# Patient Record
Sex: Male | Born: 1985 | Race: White | Hispanic: No | Marital: Married | Smoking: Never smoker
Health system: Southern US, Community
[De-identification: ages and names within clinical notes are randomized; demographics above are authoritative.]

## PROBLEM LIST (undated history)

## (undated) DIAGNOSIS — K219 Gastro-esophageal reflux disease without esophagitis: Secondary | ICD-10-CM

## (undated) DIAGNOSIS — I471 Supraventricular tachycardia: Secondary | ICD-10-CM

## (undated) DIAGNOSIS — I4719 Other supraventricular tachycardia: Secondary | ICD-10-CM

## (undated) DIAGNOSIS — U071 COVID-19: Secondary | ICD-10-CM

## (undated) HISTORY — DX: COVID-19: U07.1

## (undated) HISTORY — PX: COLONOSCOPY: SHX174

---
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
## 2008-09-10 ENCOUNTER — Emergency Department: Payer: Self-pay | Admitting: Emergency Medicine

## 2009-01-21 ENCOUNTER — Ambulatory Visit: Payer: Self-pay | Admitting: Gastroenterology

## 2009-03-18 ENCOUNTER — Ambulatory Visit: Payer: Self-pay | Admitting: Gastroenterology

## 2015-09-26 ENCOUNTER — Other Ambulatory Visit: Payer: Self-pay | Admitting: Family Medicine

## 2015-09-27 LAB — CMP12+LP+TP+TSH+6AC+CBC/D/PLT
ALT: 13 IU/L (ref 0–44)
AST: 11 IU/L (ref 0–40)
Albumin/Globulin Ratio: 2 (ref 1.1–2.5)
Albumin: 4.6 g/dL (ref 3.5–5.5)
Alkaline Phosphatase: 58 IU/L (ref 39–117)
BASOS ABS: 0 10*3/uL (ref 0.0–0.2)
BUN/Creatinine Ratio: 10 (ref 8–19)
BUN: 12 mg/dL (ref 6–20)
Basos: 1 %
Bilirubin Total: 1.6 mg/dL — ABNORMAL HIGH (ref 0.0–1.2)
CREATININE: 1.24 mg/dL (ref 0.76–1.27)
Calcium: 9.5 mg/dL (ref 8.7–10.2)
Chloride: 99 mmol/L (ref 97–108)
Chol/HDL Ratio: 2.4 ratio units (ref 0.0–5.0)
Cholesterol, Total: 128 mg/dL (ref 100–199)
EOS (ABSOLUTE): 0.3 10*3/uL (ref 0.0–0.4)
Eos: 6 %
Estimated CHD Risk: <0.5 times avg. (ref 0.0–1.0)
Free Thyroxine Index: 3 (ref 1.2–4.9)
GFR calc Af Amer: 91 mL/min/{1.73_m2} (ref >59)
GFR, EST NON AFRICAN AMERICAN: 79 mL/min/{1.73_m2} (ref >59)
GGT: 19 IU/L (ref 0–65)
GLOBULIN, TOTAL: 2.3 g/dL (ref 1.5–4.5)
GLUCOSE: 106 mg/dL — AB (ref 65–99)
HDL: 54 mg/dL (ref >39)
Hematocrit: 43.9 % (ref 37.5–51.0)
Hemoglobin: 15 g/dL (ref 12.6–17.7)
IRON: 117 ug/dL (ref 38–169)
Immature Grans (Abs): 0 10*3/uL (ref 0.0–0.1)
Immature Granulocytes: 0 %
LDH: 103 IU/L — ABNORMAL LOW (ref 121–224)
LDL Calculated: 63 mg/dL (ref 0–99)
LYMPHS ABS: 1.7 10*3/uL (ref 0.7–3.1)
Lymphs: 39 %
MCH: 31.3 pg (ref 26.6–33.0)
MCHC: 34.2 g/dL (ref 31.5–35.7)
MCV: 92 fL (ref 79–97)
MONOCYTES: 9 %
MONOS ABS: 0.4 10*3/uL (ref 0.1–0.9)
NEUTROS ABS: 1.9 10*3/uL (ref 1.4–7.0)
Neutrophils: 45 %
PHOSPHORUS: 3.6 mg/dL (ref 2.5–4.5)
PLATELETS: 204 10*3/uL (ref 150–379)
POTASSIUM: 4.2 mmol/L (ref 3.5–5.2)
RBC: 4.8 x10E6/uL (ref 4.14–5.80)
RDW: 13.2 % (ref 12.3–15.4)
Sodium: 139 mmol/L (ref 134–144)
T3 UPTAKE RATIO: 38 % (ref 24–39)
T4 TOTAL: 7.8 ug/dL (ref 4.5–12.0)
TSH: 1.61 u[IU]/mL (ref 0.450–4.500)
Total Protein: 6.9 g/dL (ref 6.0–8.5)
Triglycerides: 53 mg/dL (ref 0–149)
URIC ACID: 6.9 mg/dL (ref 3.7–8.6)
VLDL Cholesterol Cal: 11 mg/dL (ref 5–40)
WBC: 4.3 10*3/uL (ref 3.4–10.8)

## 2016-03-18 ENCOUNTER — Emergency Department
Admission: EM | Admit: 2016-03-18 | Discharge: 2016-03-18 | Disposition: A | Discharge status: Home / Self Care | Payer: Worker's Compensation | Attending: Emergency Medicine | Admitting: Emergency Medicine

## 2016-03-18 ENCOUNTER — Emergency Department: Payer: Worker's Compensation

## 2016-03-18 DIAGNOSIS — S63633A Sprain of interphalangeal joint of left middle finger, initial encounter: Secondary | ICD-10-CM | POA: Diagnosis not present

## 2016-03-18 DIAGNOSIS — Y929 Unspecified place or not applicable: Secondary | ICD-10-CM | POA: Diagnosis not present

## 2016-03-18 DIAGNOSIS — Y9389 Activity, other specified: Secondary | ICD-10-CM | POA: Diagnosis not present

## 2016-03-18 DIAGNOSIS — S8391XA Sprain of unspecified site of right knee, initial encounter: Secondary | ICD-10-CM | POA: Insufficient documentation

## 2016-03-18 DIAGNOSIS — Y990 Civilian activity done for income or pay: Secondary | ICD-10-CM | POA: Insufficient documentation

## 2016-03-18 DIAGNOSIS — M25561 Pain in right knee: Secondary | ICD-10-CM | POA: Diagnosis present

## 2016-03-18 DIAGNOSIS — S63619A Unspecified sprain of unspecified finger, initial encounter: Secondary | ICD-10-CM

## 2016-03-18 MED ORDER — NAPROXEN 500 MG PO TABS: 500.0000 mg | ORAL_TABLET | Freq: Two times a day (BID) | ORAL | Status: DC

## 2016-03-18 MED ORDER — TRAMADOL HCL 50 MG PO TABS: 50.0000 mg | ORAL_TABLET | Freq: Four times a day (QID) | ORAL | Status: DC | PRN

## 2016-03-18 NOTE — ED Notes (Addendum)
See triage note  States [place] was [place] [facility] [facility] this am  having pain to right knee and to left hand  Pain is from left middle finger to lateral hand
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
## 2016-03-18 NOTE — Discharge Instructions (Signed)
Finger Sprain °A finger sprain happens when the bands of tissue that hold the finger bones together (ligaments) stretch too much and tear. °HOME CARE °· Keep your injured finger raised (elevated) when possible. °· Put ice on the injured area, twice a day, for 2 to 3 days. °¨ Put ice in a plastic bag. °¨ Place a towel between your skin and the bag. °¨ Leave the ice on for 15 minutes. °· Only take medicine as told by your doctor. °· Do not wear rings on the injured finger. °· Protect your finger until pain and stiffness go away (usually 3 to 4 weeks). °· Do not get your cast or splint to get wet. Cover your cast or splint with a plastic bag when you shower or bathe. Do not swim. °· Your doctor may suggest special exercises for you to do. These exercises will help keep or stop stiffness from happening. °GET HELP RIGHT AWAY IF: °· Your cast or splint gets damaged. °· Your pain gets worse, not better. °MAKE SURE YOU: °· Understand these instructions. °· Will watch your condition. °· Will get help right away if you are not doing well or get worse. °  °This information is not intended to replace advice given to you by your health care provider. Make sure you discuss any questions you have with your health care provider. °  °Document Released: 01/15/2011 Document Revised: 03/06/2012 Document Reviewed: 08/16/2011 °Elsevier Interactive Patient Education ©2016 Elsevier Inc. ° °
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
## 2016-08-10 ENCOUNTER — Encounter: Payer: Self-pay | Admitting: Physician Assistant

## 2016-08-10 ENCOUNTER — Ambulatory Visit: Payer: Self-pay | Admitting: Physician Assistant

## 2016-08-10 VITALS — BP 120/80 | HR 76 | Temp 98.2°F

## 2016-08-10 DIAGNOSIS — J069 Acute upper respiratory infection, unspecified: Secondary | ICD-10-CM

## 2016-08-10 MED ORDER — AMOXICILLIN 875 MG PO TABS: 875.0000 mg | ORAL_TABLET | Freq: Two times a day (BID) | ORAL | 0 refills | Status: DC

## 2016-08-10 MED ORDER — FLUTICASONE PROPIONATE 50 MCG/ACT NA SUSP: 2.0000 | Freq: Every day | NASAL | 6 refills | Status: DC

## 2016-08-10 NOTE — Progress Notes (Signed)
S: C/o runny nose and congestion for 6 days, hoarse voice, dry cough, + fever, chills last night; denies cp/sob, v/d; mucus is green and thick today,  cough is sporadic, c/o of facial and dental pain.   Using otc meds: sudafed O: PE: vitals wnl, nad, perrl eomi, normocephalic, tms dull, nasal mucosa red and swollen, voice is hoarse, throat injected, neck supple no lymph, lungs c t a, cv rrr, neuro intact  A:  Acute sinusitis   P: drink fluids, continue regular meds , use otc meds of choice, return if not improving in 5 days, return earlier if worsening

## 2017-01-28 ENCOUNTER — Encounter (INDEPENDENT_AMBULATORY_CARE_PROVIDER_SITE_OTHER): Payer: Managed Care, Other (non HMO) | Admitting: Ophthalmology

## 2017-01-28 DIAGNOSIS — H43813 Vitreous degeneration, bilateral: Secondary | ICD-10-CM | POA: Diagnosis not present

## 2017-01-28 DIAGNOSIS — H33302 Unspecified retinal break, left eye: Secondary | ICD-10-CM

## 2017-02-04 ENCOUNTER — Ambulatory Visit (INDEPENDENT_AMBULATORY_CARE_PROVIDER_SITE_OTHER): Payer: Managed Care, Other (non HMO) | Admitting: Ophthalmology

## 2017-02-04 DIAGNOSIS — H33302 Unspecified retinal break, left eye: Secondary | ICD-10-CM

## 2017-06-06 ENCOUNTER — Ambulatory Visit (INDEPENDENT_AMBULATORY_CARE_PROVIDER_SITE_OTHER): Payer: Managed Care, Other (non HMO) | Admitting: Ophthalmology

## 2017-06-06 DIAGNOSIS — H43813 Vitreous degeneration, bilateral: Secondary | ICD-10-CM | POA: Diagnosis not present

## 2017-06-06 DIAGNOSIS — H33302 Unspecified retinal break, left eye: Secondary | ICD-10-CM

## 2017-06-21 ENCOUNTER — Ambulatory Visit: Payer: Self-pay | Admitting: Physician Assistant

## 2017-06-21 VITALS — BP 119/70 | HR 61 | Temp 98.5°F | Resp 16

## 2017-06-21 DIAGNOSIS — R21 Rash and other nonspecific skin eruption: Secondary | ICD-10-CM

## 2017-06-21 NOTE — Progress Notes (Signed)
S: c/o rash between 2 fingers, thought it was poison ivy, has clear bumps with clear drainage, no fever/chills, no redness, has picked a the site a little  O: vitals wnl, nad, skin with clear raised bumps on middle finger, some scabs at bumps on both fingers where they rub together, full rom, n/v intact, no pus or drainage noted  A: rash  P: otc calamine lotion, ivy ease, if worsening return to clinic or see dermatology

## 2017-07-04 ENCOUNTER — Encounter: Payer: Self-pay | Admitting: *Deleted

## 2017-07-05 ENCOUNTER — Ambulatory Visit
Admission: RE | Admit: 2017-07-05 | Discharge: 2017-07-05 | Disposition: A | Discharge status: Home / Self Care | Payer: Managed Care, Other (non HMO) | Source: Ambulatory Visit | Attending: Gastroenterology | Admitting: Gastroenterology

## 2017-07-05 ENCOUNTER — Ambulatory Visit: Payer: Managed Care, Other (non HMO) | Admitting: Anesthesiology

## 2017-07-05 ENCOUNTER — Encounter: Payer: Self-pay | Admitting: *Deleted

## 2017-07-05 ENCOUNTER — Encounter
Admission: RE | Disposition: A | Discharge status: Home / Self Care | Payer: Self-pay | Source: Ambulatory Visit | Attending: Gastroenterology

## 2017-07-05 DIAGNOSIS — R1030 Lower abdominal pain, unspecified: Secondary | ICD-10-CM | POA: Diagnosis not present

## 2017-07-05 DIAGNOSIS — Z882 Allergy status to sulfonamides status: Secondary | ICD-10-CM | POA: Diagnosis not present

## 2017-07-05 DIAGNOSIS — R194 Change in bowel habit: Secondary | ICD-10-CM | POA: Insufficient documentation

## 2017-07-05 DIAGNOSIS — I471 Supraventricular tachycardia: Secondary | ICD-10-CM | POA: Insufficient documentation

## 2017-07-05 DIAGNOSIS — Z79899 Other long term (current) drug therapy: Secondary | ICD-10-CM | POA: Insufficient documentation

## 2017-07-05 DIAGNOSIS — K219 Gastro-esophageal reflux disease without esophagitis: Secondary | ICD-10-CM | POA: Insufficient documentation

## 2017-07-05 HISTORY — DX: Other supraventricular tachycardia: I47.19

## 2017-07-05 HISTORY — DX: Gastro-esophageal reflux disease without esophagitis: K21.9

## 2017-07-05 HISTORY — DX: Supraventricular tachycardia: I47.1

## 2017-07-05 HISTORY — PX: COLONOSCOPY WITH PROPOFOL: SHX5780

## 2017-07-05 SURGERY — COLONOSCOPY WITH PROPOFOL
Anesthesia: General

## 2017-07-05 MED ORDER — LIDOCAINE HCL (CARDIAC) 20 MG/ML IV SOLN
INTRAVENOUS | Status: DC | PRN
  Administered 2017-07-05: 50 mg via INTRAVENOUS

## 2017-07-05 MED ORDER — SODIUM CHLORIDE 0.9 % IV SOLN
INTRAVENOUS | Status: DC
  Administered 2017-07-05: 15:00:00 via INTRAVENOUS

## 2017-07-05 MED ORDER — MIDAZOLAM HCL 2 MG/2ML IJ SOLN
INTRAMUSCULAR | Status: DC | PRN
  Administered 2017-07-05: 2 mg via INTRAVENOUS

## 2017-07-05 MED ORDER — MIDAZOLAM HCL 2 MG/2ML IJ SOLN
INTRAMUSCULAR | Status: AC
  Filled 2017-07-05: qty 2

## 2017-07-05 MED ORDER — PROPOFOL 10 MG/ML IV BOLUS
INTRAVENOUS | Status: DC | PRN
  Administered 2017-07-05 (×2): 30 mg via INTRAVENOUS
  Administered 2017-07-05: 20 mg via INTRAVENOUS
  Administered 2017-07-05: 30 mg via INTRAVENOUS
  Administered 2017-07-05: 40 mg via INTRAVENOUS

## 2017-07-05 MED ORDER — SODIUM CHLORIDE 0.9 % IV SOLN: INTRAVENOUS | Status: DC

## 2017-07-05 MED ORDER — PROPOFOL 500 MG/50ML IV EMUL
INTRAVENOUS | Status: AC
  Filled 2017-07-05: qty 50

## 2017-07-05 MED ORDER — PROPOFOL 500 MG/50ML IV EMUL
INTRAVENOUS | Status: DC | PRN
  Administered 2017-07-05: 140 ug/kg/min via INTRAVENOUS

## 2017-07-05 NOTE — Anesthesia Post-op Follow-up Note (Cosign Needed)
Anesthesia QCDR form completed.        

## 2017-07-05 NOTE — Anesthesia Postprocedure Evaluation (Signed)
Anesthesia Post Note  Patient: Robert Bautista  Procedure(Bautista) Performed: Procedure(Bautista) (LRB): COLONOSCOPY WITH PROPOFOL (N/A)  Patient location during evaluation: Endoscopy Anesthesia Type: General Level of consciousness: awake and alert Pain management: pain level controlled Vital Signs Assessment: post-procedure vital signs reviewed and stable Respiratory status: spontaneous breathing, nonlabored ventilation, respiratory function stable and patient connected to nasal cannula oxygen Cardiovascular status: blood pressure returned to baseline and stable Postop Assessment: no signs of nausea or vomiting Anesthetic complications: no     Last Vitals:  Vitals:   07/05/17 1739 07/05/17 1749  BP: (!) 92/50 (!) 109/50  Pulse: (!) 59 68  Resp: 20 17  Temp:      Last Pain:  Vitals:   07/05/17 1719  TempSrc: Tympanic                 Robert Bautista

## 2017-07-05 NOTE — H&P (Signed)
Outpatient short stay form Pre-procedure 07/05/2017 4:32 PM Christena DeemMartin U Camrin Gearheart MD  Primary Physician: Dr. Joen LauraLaura Bliss  Reason for visit:  Colonoscopy  History of present illness:  Patient is a 31 year old male presenting today as above. He has a history of a change of bowel habits with alternating loose and firm stools. He has seen no blood in the stools. These symptoms of been getting worse over the period past year. There does not seem to be a food trigger. He has not yet been checked for celiac sprue for lactose intolerance. He does get sensation of incomplete defecation and much of his symptoms sound like irritable bowel. He takes no aspirin or blood thinning agents. He tolerated his prep well. He states that dicyclomine has been of some benefit.    Current Facility-Administered Medications:  .  0.9 %  sodium chloride infusion, , Intravenous, Continuous, Christena DeemSkulskie, Seven Marengo U, MD .  0.9 %  sodium chloride infusion, , Intravenous, Continuous, Christena DeemSkulskie, Othman Masur U, MD  Prescriptions Prior to Admission  Medication Sig Dispense Refill Last Dose  . amoxicillin (AMOXIL) 875 MG tablet Take 1 tablet (875 mg total) by mouth 2 (two) times daily. 20 tablet 0 Past Week at Unknown time  . dicyclomine (BENTYL) 10 MG capsule Take 10 mg by mouth 4 (four) times daily -  before meals and at bedtime.   Past Week at Unknown time  . fluticasone (FLONASE) 50 MCG/ACT nasal spray Place 2 sprays into both nostrils daily. 16 g 6 Past Week at Unknown time  . HYDROcodone-acetaminophen (NORCO/VICODIN) 5-325 MG tablet Take 1 tablet by mouth every 6 (six) hours as needed for moderate pain.   Past Week at Unknown time  . hyoscyamine (LEVSIN) 0.125 MG/5ML ELIX Take 0.125 mg by mouth.   Past Week at Unknown time  . terbinafine (LAMISIL) 250 MG tablet Take 250 mg by mouth daily.   Past Week at Unknown time     Allergies  Allergen Reactions  . Sulfa Antibiotics Rash     Past Medical History:  Diagnosis Date  . GERD  (gastroesophageal reflux disease)   . PAT (paroxysmal atrial tachycardia) (HCC)     Review of systems:      Physical Exam    Heart and lungs: Regular rate and rhythm without rub or gallop, lungs are bilaterally clear.    HEENT: Normocephalic atraumatic eyes are anicteric    Other:     Pertinant exam for procedure: Soft nontender nondistended bowel sounds positive normoactive.   Planned proceedures:  I have discussed the risks benefits and complications of procedures to include not limited to bleeding, infection, perforation and the risk of sedation and the patient wishes to proceed.     Christena DeemMartin U Cadie Sorci, MD Gastroenterology 07/05/2017  4:32 PM

## 2017-07-05 NOTE — Transfer of Care (Signed)
Immediate Anesthesia Transfer of Care Note  Patient: Robert Bautista  Procedure(s) Performed: Procedure(s): COLONOSCOPY WITH PROPOFOL (N/A)  Patient Location: PACU  Anesthesia Type:General  Level of Consciousness: sedated  Airway & Oxygen Therapy: Patient Spontanous Breathing and Patient connected to nasal cannula oxygen  Post-op Assessment: Report given to RN and Post -op Vital signs reviewed and stable  Post vital signs: Reviewed and stable  Last Vitals:  Vitals:   07/05/17 1503 07/05/17 1719  BP: 124/72 (!) 97/47  Pulse: 72 71  Resp: 16 (!) 23  Temp: 36.5 C (!) 36.2 C    Last Pain:  Vitals:   07/05/17 1719  TempSrc: Tympanic      Patients Stated Pain Goal: 0 (07/05/17 1503)  Complications: No apparent anesthesia complications

## 2017-07-05 NOTE — Anesthesia Procedure Notes (Signed)
Date/Time: 07/05/2017 4:30 PM Performed by: Ginger CarneMICHELET, Ferris Tally Pre-anesthesia Checklist: Patient identified, Emergency Drugs available, Suction available, Patient being monitored and Timeout performed Patient Re-evaluated:Patient Re-evaluated prior to inductionOxygen Delivery Method: Nasal cannula

## 2017-07-05 NOTE — Op Note (Signed)
[facility] Gastroenterology Patient Name: Robert Bautista Procedure Date: 07/05/2017 4:27 PM MRN: [id number] Account #: [id number] Date of Birth: 02-Dec-1986 Admit Type: Outpatient Age: 31 Room: [facility] ENDO ROOM 3 Gender: Male Note Status: Finalized Procedure:            Colonoscopy Indications:          Lower abdominal pain, Change in bowel habits Providers:            Christena Deem, MD Referring MD:         Dortha Kern (Referring MD) Medicines:            Monitored Anesthesia Care Complications:        No immediate complications. Procedure:            Pre-Anesthesia Assessment:                       - ASA Grade Assessment: II - A patient with mild                        systemic disease.                       After obtaining informed consent, the colonoscope was                        passed under direct vision. Throughout the procedure,                        the patient's blood pressure, pulse, and oxygen                        saturations were monitored continuously. The                        Colonoscope was introduced through the anus and                        advanced to the the terminal ileum. The colonoscopy was                        performed with moderate difficulty due to significant                        looping. Successful completion of the procedure was                        aided by using manual pressure. The patient tolerated                        the procedure well. The quality of the bowel                        preparation was good. Findings:      The colon (entire examined portion) appeared normal. Biopsies for       histology were taken with a cold forceps from the right colon and left       colon for evaluation of microscopic colitis.      The digital rectal exam was normal.      The retroflexed view of the distal rectum and anal verge was normal and  showed no anal or rectal abnormalities.      The terminal ileum  appeared normal. Impression:           - The entire examined colon is normal. Biopsied.                       - The distal rectum and anal verge are normal on                        retroflexion view. Recommendation:       - Discharge patient to home.                       - Use Citrucel one tablespoon PO daily.                       - Use Bentyl (dicyclomine) 10 mg PO BID 30 min AC.                       - Return to GI clinic in 1 month.                       - Await pathology results.                       - recommend testing for celiac sprue and lactose                        intolerance. Procedure Code(s):    --- Professional ---                       [phone], Colonoscopy, flexible; with biopsy, single or                        multiple Diagnosis Code(s):    --- Professional ---                       R10.30, Lower abdominal pain, unspecified                       R19.4, Change in bowel habit CPT copyright 2016 American Medical Association. All rights reserved. The codes documented in this report are preliminary and upon coder review may  be revised to meet current compliance requirements. Christena DeemMartin U Skulskie, MD 07/05/2017 5:20:04 PM This report has been signed electronically. Number of Addenda: 0 Note Initiated On: 07/05/2017 4:27 PM Scope Withdrawal Time: 0 hours 8 minutes 19 seconds  Total Procedure Duration: 0 hours 24 minutes 10 seconds       [facility]
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
## 2017-07-06 ENCOUNTER — Encounter: Payer: Self-pay | Admitting: Gastroenterology

## 2017-07-07 LAB — SURGICAL PATHOLOGY

## 2017-12-29 ENCOUNTER — Ambulatory Visit: Payer: Self-pay | Admitting: Physician Assistant

## 2017-12-29 ENCOUNTER — Encounter: Payer: Self-pay | Admitting: Physician Assistant

## 2017-12-29 VITALS — BP 110/70 | HR 64 | Temp 98.5°F | Resp 16

## 2017-12-29 DIAGNOSIS — L600 Ingrowing nail: Secondary | ICD-10-CM

## 2017-12-29 MED ORDER — CEPHALEXIN 500 MG PO CAPS: 500.0000 mg | ORAL_CAPSULE | Freq: Four times a day (QID) | ORAL | 0 refills | Status: DC

## 2017-12-29 MED ORDER — NAPROXEN 500 MG PO TABS: 500.0000 mg | ORAL_TABLET | Freq: Two times a day (BID) | ORAL | 0 refills | Status: DC

## 2017-12-29 NOTE — Progress Notes (Signed)
   Subjective: Right great toe pain     Patient ID: Robert Bautista, male    DOB: 02/05/1986, 32 y.o.   MRN: [id number]  HPI Patient stated 4 days of increasing edema and redness to the great right toe. Patient has a history of ingrown toenails. Patient denies any drainage from the site. No palliative measures for complaint.   Review of Systems Negative except for complaint    Objective:   Physical Exam Erythematous edematous medial nailbed of the right great toe. Moderate guarding palpation but no drainage.      Assessment & Plan: Infected ingrown toenail   Patient given discharge care instructions and advised to take medication as directed. Patient given a prescription for Keflex and naproxen.

## 2018-02-12 ENCOUNTER — Encounter: Payer: Self-pay | Admitting: Emergency Medicine

## 2018-02-12 ENCOUNTER — Emergency Department
Admission: EM | Admit: 2018-02-12 | Discharge: 2018-02-12 | Disposition: A | Discharge status: Home / Self Care | Payer: Worker's Compensation | Attending: Emergency Medicine | Admitting: Emergency Medicine

## 2018-02-12 DIAGNOSIS — Y939 Activity, unspecified: Secondary | ICD-10-CM | POA: Insufficient documentation

## 2018-02-12 DIAGNOSIS — Y990 Civilian activity done for income or pay: Secondary | ICD-10-CM | POA: Diagnosis not present

## 2018-02-12 DIAGNOSIS — Y92149 Unspecified place in prison as the place of occurrence of the external cause: Secondary | ICD-10-CM | POA: Insufficient documentation

## 2018-02-12 DIAGNOSIS — S0083XA Contusion of other part of head, initial encounter: Secondary | ICD-10-CM | POA: Insufficient documentation

## 2018-02-12 DIAGNOSIS — S0993XA Unspecified injury of face, initial encounter: Secondary | ICD-10-CM | POA: Diagnosis present
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
## 2018-02-12 NOTE — Discharge Instructions (Signed)
No acute findings on physical exam.  Advised follow discharge care instructions.  Return back to ED if condition worsens.

## 2018-02-12 NOTE — ED Notes (Signed)
Patient's workers comp does not require anything at this time.

## 2019-08-21 ENCOUNTER — Ambulatory Visit: Payer: Managed Care, Other (non HMO) | Admitting: Adult Health

## 2019-08-21 ENCOUNTER — Other Ambulatory Visit: Payer: Self-pay

## 2019-08-21 ENCOUNTER — Encounter: Payer: Self-pay | Admitting: Adult Health

## 2019-08-21 DIAGNOSIS — R059 Cough, unspecified: Secondary | ICD-10-CM

## 2019-08-21 DIAGNOSIS — R6889 Other general symptoms and signs: Secondary | ICD-10-CM

## 2019-08-21 DIAGNOSIS — R05 Cough: Secondary | ICD-10-CM | POA: Diagnosis not present

## 2019-08-21 DIAGNOSIS — Z7189 Other specified counseling: Secondary | ICD-10-CM | POA: Diagnosis not present

## 2019-08-21 DIAGNOSIS — Z20822 Contact with and (suspected) exposure to covid-19: Secondary | ICD-10-CM

## 2019-08-21 MED ORDER — BENZONATATE 100 MG PO CAPS: 100.0000 mg | ORAL_CAPSULE | Freq: Two times a day (BID) | ORAL | 0 refills | Status: DC | PRN

## 2019-08-21 MED ORDER — ALBUTEROL SULFATE HFA 108 (90 BASE) MCG/ACT IN AERS: 1.0000 | INHALATION_SPRAY | Freq: Four times a day (QID) | RESPIRATORY_TRACT | 0 refills | Status: DC | PRN

## 2019-08-21 MED ORDER — CLARITHROMYCIN 500 MG PO TABS: 500.0000 mg | ORAL_TABLET | Freq: Two times a day (BID) | ORAL | 0 refills | Status: DC

## 2019-08-21 NOTE — Patient Instructions (Signed)
Advised patient call the office or your primary care doctor for an appointment if no improvement within 72 hours or if any symptoms change or worsen at any time  Advised ER or urgent Care if after hours or on weekend. Call 911 for emergency symptoms at any time.Patinet verbalized understanding of all instructions given/reviewed and treatment plan and has no further questions or concerns at this time.      Benzonatate capsules What is this medicine? BENZONATATE (ben ZOE na tate) is used to treat cough. This medicine may be used for other purposes; ask your health care provider or pharmacist if you have questions. COMMON BRAND NAME(S): Tessalon Perles, Zonatuss What should I tell my health care provider before I take this medicine? They need to know if you have any of these conditions:  kidney or liver disease  an unusual or allergic reaction to benzonatate, anesthetics, other medicines, foods, dyes, or preservatives  pregnant or trying to get pregnant  breast-feeding How should I use this medicine? Take this medicine by mouth with a glass of water. Follow the directions on the prescription label. Avoid breaking, chewing, or sucking the capsule, as this can cause serious side effects. Take your medicine at regular intervals. Do not take your medicine more often than directed. Talk to your pediatrician regarding the use of this medicine in children. While this drug may be prescribed for children as young as 32 years old for selected conditions, precautions do apply. Overdosage: If you think you have taken too much of this medicine contact a poison control center or emergency room at once. NOTE: This medicine is only for you. Do not share this medicine with others. What if I miss a dose? If you miss a dose, take it as soon as you can. If it is almost time for your next dose, take only that dose. Do not take double or extra doses. What may interact with this medicine? Do not take this medicine  with any of the following medications:  MAOIs like Carbex, Eldepryl, Marplan, Nardil, and Parnate This list may not describe all possible interactions. Give your health care provider a list of all the medicines, herbs, non-prescription drugs, or dietary supplements you use. Also tell them if you smoke, drink alcohol, or use illegal drugs. Some items may interact with your medicine. What should I watch for while using this medicine? Tell your doctor if your symptoms do not improve or if they get worse. If you have a high fever, skin rash, or headache, see your health care professional. You may get drowsy or dizzy. Do not drive, use machinery, or do anything that needs mental alertness until you know how this medicine affects you. Do not sit or stand up quickly, especially if you are an older patient. This reduces the risk of dizzy or fainting spells. What side effects may I notice from receiving this medicine? Side effects that you should report to your doctor or health care professional as soon as possible:  allergic reactions like skin rash, itching or hives, swelling of the face, lips, or tongue  breathing problems  chest pain  confusion or hallucinations  irregular heartbeat  numbness of mouth or throat  seizures Side effects that usually do not require medical attention (report to your doctor or health care professional if they continue or are bothersome):  burning feeling in the eyes  constipation  headache  nasal congestion  stomach upset This list may not describe all possible side effects. Call your doctor  for medical advice about side effects. You may report side effects to FDA at 1-800-FDA-1088. Where should I keep my medicine? Keep out of the reach of children. Store at room temperature between 15 and 30 degrees C (59 and 86 degrees F). Keep tightly closed. Protect from light and moisture. Throw away any unused medicine after the expiration date. NOTE: This sheet is a  summary. It may not cover all possible information. If you have questions about this medicine, talk to your doctor, pharmacist, or health care provider.  2020 Elsevier/Gold Standard (2008-03-13 14:52:56) Albuterol inhalation aerosol What is this medicine? ALBUTEROL (al Gaspar Bidding) is a bronchodilator. It helps open up the airways in your lungs to make it easier to breathe. This medicine is used to treat and to prevent bronchospasm. This medicine may be used for other purposes; ask your health care provider or pharmacist if you have questions. COMMON BRAND NAME(S): Proair HFA, Proventil, Proventil HFA, Respirol, Ventolin, Ventolin HFA What should I tell my health care provider before I take this medicine? They need to know if you have any of the following conditions:  diabetes  heart disease or irregular heartbeat  high blood pressure  pheochromocytoma  seizures  thyroid disease  an unusual or allergic reaction to albuterol, levalbuterol, other medicines, foods, dyes, or preservatives  pregnant or trying to get pregnant  breast-feeding How should I use this medicine? This medicine is for inhalation through the mouth. Follow the directions on your prescription label. Take your medicine at regular intervals. Do not use more often than directed. Make sure that you are using your inhaler correctly. Ask your doctor or health care provider if you have any questions. Talk to your pediatrician regarding the use of this medicine in children. While this drug may be prescribed for children as young as 4 years for selected conditions, precautions do apply. Overdosage: If you think you have taken too much of this medicine contact a poison control center or emergency room at once. NOTE: This medicine is only for you. Do not share this medicine with others. What if I miss a dose? If you miss a dose, use it as soon as you can. If it is almost time for your next dose, use only that dose. Do not use  double or extra doses. What may interact with this medicine?  anti-infectives like chloroquine and pentamidine  caffeine  cisapride  diuretics  medicines for colds  medicines for depression or for emotional or psychotic conditions  medicines for weight loss including some herbal products  methadone  some antibiotics like clarithromycin, erythromycin, levofloxacin, and linezolid  some heart medicines  steroid hormones like dexamethasone, cortisone, hydrocortisone  theophylline  thyroid hormones This list may not describe all possible interactions. Give your health care provider a list of all the medicines, herbs, non-prescription drugs, or dietary supplements you use. Also tell them if you smoke, drink alcohol, or use illegal drugs. Some items may interact with your medicine. What should I watch for while using this medicine? Tell your doctor or health care professional if your symptoms do not improve. Do not use extra albuterol. If your asthma or bronchitis gets worse while you are using this medicine, call your doctor right away. If your mouth gets dry try chewing sugarless gum or sucking hard candy. Drink water as directed. What side effects may I notice from receiving this medicine? Side effects that you should report to your doctor or health care professional as soon as possible:  allergic reactions like skin rash, itching or hives, swelling of the face, lips, or tongue  breathing problems  chest pain  feeling faint or lightheaded, falls  high blood pressure  irregular heartbeat  fever  muscle cramps or weakness  pain, tingling, numbness in the hands or feet  vomiting Side effects that usually do not require medical attention (report to your doctor or health care professional if they continue or are bothersome):  changes in taste  cough  dry mouth  headache  nervousness or trembling  stomach upset  stuffy or runny nose  throat  irritation  trouble sleeping This list may not describe all possible side effects. Call your doctor for medical advice about side effects. You may report side effects to FDA at 1-800-FDA-1088. Where should I keep my medicine? Keep out of the reach of children. Store Proventil HFA and ProAir HFA at room temperature between 15 and 25 degrees C (59 and 77 degrees F). Store Ventolin HFA at room temperature between 20 and 25 degrees C (68 and 77 degrees F); it may be stored between 15 and 30 degrees C (59 and 86 degrees F) on occasion. The contents are under pressure and may burst when exposed to heat or flame. Do not freeze. This medicine does not work as well if it is too cold. Throw away the inhaler when the dose counter displays "0" or after the expiration date on the package, whichever comes first. Ventolin HFA should be thrown away 12 months after removing it from the foil pouch. NOTE: This sheet is a summary. It may not cover all possible information. If you have questions about this medicine, talk to your doctor, pharmacist, or health care provider.  2020 Elsevier/Gold Standard (2019-03-29 12:46:54) Clarithromycin tablets What is this medicine? CLARITHROMYCIN (kla RITH roe mye sin) is a macrolide antibiotic. It is used to treat or prevent certain kinds of bacterial infections. It will not work for colds, flu, or other viral infections. This medicine may be used for other purposes; ask your health care provider or pharmacist if you have questions. COMMON BRAND NAME(S): Biaxin What should I tell my health care provider before I take this medicine? They need to know if you have any of these conditions:  heart disease  history of irregular heartbeat  kidney disease  liver disease  myasthenia gravis  an unusual or allergic reaction to clarithromycin, other macrolide antibiotics, other medicines, foods, dyes, or preservatives  pregnant or trying to get pregnant  breast-feeding How  should I use this medicine? Take this medicine by mouth with glass of water. If it upsets your stomach you can take it with milk or food. Follow the directions on the prescription label. Take your medicine at regular intervals. Do not take your medicine more often than directed. Take all of your medicine as directed even if you think your are better. Do not skip doses or stop your medicine early. Talk to your pediatrician regarding the use of this medicine in children. Special care may be needed. Overdosage: If you think you have taken too much of this medicine contact a poison control center or emergency room at once. NOTE: This medicine is only for you. Do not share this medicine with others. What if I miss a dose? If you miss a dose, take it as soon as you can. If it is almost time for your next dose, take only that dose. Do not take double or extra doses. What may interact with this medicine? Do  not take this medicine with any of the following medications:  certain medicines for fungal infections like fluconazole, itraconazole, ketoconazole, posaconazole, voriconazole  cisapride  dronedarone  naloxegol  pimozide  thioridazine This medicine may also interact with the following medications:  birth control pills  carbamazepine  certain medicines for anxiety or sleep like alprazolam, triazolam  certain medicines for cholesterol like atorvastatin, lovastatin, simvastatin  certain medicines for irregular heart beat like amiodarone, disopyramide, flecainide, procainamide, quinidine  certain medicines that treat or prevent clots like warfarin  colchicine  cyclosporine  digoxin  dofetilide  ergot alkaloids like ergotamine, dihydroergotamine  other antibiotics like grepafloxacin, rifabutin, sparfloxacin  other medicines that prolong the QT interval (cause an abnormal heart rhythm)  ritonavir  sildenafil  terfenadine  theophylline  zidovudine  ziprasidone This list  may not describe all possible interactions. Give your health care provider a list of all the medicines, herbs, non-prescription drugs, or dietary supplements you use. Also tell them if you smoke, drink alcohol, or use illegal drugs. Some items may interact with your medicine. What should I watch for while using this medicine? Tell your doctor or health care provider if your symptoms do not improve. This medicine may cause serious skin reactions. They can happen weeks to months after starting the medicine. Contact your health care provider right away if you notice fevers or flu-like symptoms with a rash. The rash may be red or purple and then turn into blisters or peeling of the skin. Or, you might notice a red rash with swelling of the face, lips or lymph nodes in your neck or under your arms. Do not treat diarrhea with over the counter products. Contact your doctor if you have diarrhea that lasts more than 2 days or if it is severe and watery. If you have diabetes, monitor your blood sugar carefully while on this medicine. What side effects may I notice from receiving this medicine? Side effects that you should report to your doctor or health care professional as soon as possible:  allergic reactions like skin rash, itching or hives, swelling of the face, lips, or tongue  irregular heartbeat or chest pain  pain or difficulty passing urine  rash, fever, and swollen lymph nodes  redness, blistering, peeling or loosening of the skin, including inside the mouth  yellowing of the eyes or skin Side effects that usually do not require medical attention (report to your doctor or health care professional if they continue or are bothersome):  abnormal taste  anxiety, confusion, or nightmares  diarrhea  headache  intestinal gas  stomach upset or nausea This list may not describe all possible side effects. Call your doctor for medical advice about side effects. You may report side effects to  FDA at 1-800-FDA-1088. Where should I keep my medicine? Keep out of the reach of children. Store at room temperature between 20 and 25 degrees C (68 and 77 degrees F). Keep container tightly closed. Protect from light. Throw away any unused medicine after the expiration date. NOTE: This sheet is a summary. It may not cover all possible information. If you have questions about this medicine, talk to your doctor, pharmacist, or health care provider.  2020 Elsevier/Gold Standard (2019-03-15 11:40:12) Cough, Adult A cough helps to clear your throat and lungs. A cough may be a sign of an illness or another medical condition. An acute cough may only last 2-3 weeks, while a chronic cough may last 8 or more weeks. Many things can cause a cough.  They include:  Germs (viruses or bacteria) that attack the airway.  Breathing in things that bother (irritate) your lungs.  Allergies.  Asthma.  Mucus that runs down the back of your throat (postnasal drip).  Smoking.  Acid backing up from the stomach into the tube that moves food from the mouth to the stomach (gastroesophageal reflux).  Some medicines.  Lung problems.  Other medical conditions, such as heart failure or a blood clot in the lung (pulmonary embolism). Follow these instructions at home: Medicines  Take over-the-counter and prescription medicines only as told by your doctor.  Talk with your doctor before you take medicines that stop a cough (coughsuppressants). Lifestyle   Do not smoke, and try not to be around smoke. Do not use any products that contain nicotine or tobacco, such as cigarettes, e-cigarettes, and chewing tobacco. If you need help quitting, ask your doctor.  Drink enough fluid to keep your pee (urine) pale yellow.  Avoid caffeine.  Do not drink alcohol if your doctor tells you not to drink. General instructions   Watch for any changes in your cough. Tell your doctor about them.  Always cover your mouth  when you cough.  Stay away from things that make you cough, such as perfume, candles, campfire smoke, or cleaning products.  If the air is dry, use a cool mist vaporizer or humidifier in your home.  If your cough is worse at night, try using extra pillows to raise your head up higher while you sleep.  Rest as needed.  Keep all follow-up visits as told by your doctor. This is important. Contact a doctor if:  You have new symptoms.  You cough up pus.  Your cough does not get better after 2-3 weeks, or your cough gets worse.  Cough medicine does not help your cough and you are not sleeping well.  You have pain that gets worse or pain that is not helped with medicine.  You have a fever.  You are losing weight and you do not know why.  You have night sweats. Get help right away if:  You cough up blood.  You have trouble breathing.  Your heartbeat is very fast. These symptoms may be an emergency. Do not wait to see if the symptoms will go away. Get medical help right away. Call your local emergency services (911 in the [place] Do not drive yourself to the hospital. Summary  A cough helps to clear your throat and lungs. Many things can cause a cough.  Take over-the-counter and prescription medicines only as told by your doctor.  Always cover your mouth when you cough.  Contact a doctor if you have new symptoms or you have a cough that does not get better or gets worse. This information is not intended to replace advice given to you by your health care provider. Make sure you discuss any questions you have with your health care provider. Document Released: 08/26/2011 Document Revised: 01/01/2019 Document Reviewed: 01/01/2019 Elsevier Patient Education  2020 Elsevier Inc.     Person Under Monitoring Name: Robert Bautista  Location: [address] [place] [address]   Infection Prevention Recommendations for Individuals Confirmed to have, or Being Evaluated for, 2019  Novel Coronavirus (COVID-19) Infection Who Receive Care at Home  Individuals who are confirmed to have, or are being evaluated for, COVID-19 should follow the prevention steps below until a healthcare provider or local or state health department says they can return to normal activities.  Stay  home except to get medical care You should restrict activities outside your home, except for getting medical care. Do not go to work, school, or public areas, and do not use public transportation or taxis.  Call ahead before visiting your doctor Before your medical appointment, call the healthcare provider and tell them that you have, or are being evaluated for, COVID-19 infection. This will help the healthcare providers office take steps to keep other people from getting infected. Ask your healthcare provider to call the local or state health department.  Monitor your symptoms Seek prompt medical attention if your illness is worsening (e.g., difficulty breathing). Before going to your medical appointment, call the healthcare provider and tell them that you have, or are being evaluated for, COVID-19 infection. Ask your healthcare provider to call the local or state health department.  Wear a facemask You should wear a facemask that covers your nose and mouth when you are in the same room with other people and when you visit a healthcare provider. People who live with or visit you should also wear a facemask while they are in the same room with you.  Separate yourself from other people in your home As much as possible, you should stay in a different room from other people in your home. Also, you should use a separate bathroom, if available.  Avoid sharing household items You should not share dishes, drinking glasses, cups, eating utensils, towels, bedding, or other items with other people in your home. After using these items, you should wash them thoroughly with soap and water.  Cover your  coughs and sneezes Cover your mouth and nose with a tissue when you cough or sneeze, or you can cough or sneeze into your sleeve. Throw used tissues in a lined trash can, and immediately wash your hands with soap and water for at least 20 seconds or use an alcohol-based hand rub.  Wash your [employer] your hands often and thoroughly with soap and water for at least 20 seconds. You can use an alcohol-based hand sanitizer if soap and water are not available and if your hands are not visibly dirty. Avoid touching your eyes, nose, and mouth with unwashed hands.   Prevention Steps for Caregivers and Household Members of Individuals Confirmed to have, or Being Evaluated for, COVID-19 Infection Being Cared for in the Home  If you live with, or provide care at home for, a person confirmed to have, or being evaluated for, COVID-19 infection please follow these guidelines to prevent infection:  Follow healthcare providers instructions Make sure that you understand and can help the patient follow any healthcare provider instructions for all care.  Provide for the patients basic needs You should help the patient with basic needs in the home and provide support for getting groceries, prescriptions, and other personal needs.  Monitor the patients symptoms If they are getting sicker, call his or her medical provider and tell them that the patient has, or is being evaluated for, COVID-19 infection. This will help the healthcare providers office take steps to keep other people from getting infected. Ask the healthcare provider to call the local or state health department.  Limit the number of people who have contact with the patient  If possible, have only one caregiver for the patient.  Other household members should stay in another home or place of residence. If this is not possible, they should stay  in another room, or be separated from the patient as much as possible. Use  a separate  bathroom, if available.  Restrict visitors who do not have an essential need to be in the home.  Keep older adults, very young children, and other sick people away from the patient Keep older adults, very young children, and those who have compromised immune systems or chronic health conditions away from the patient. This includes people with chronic heart, lung, or kidney conditions, diabetes, and cancer.  Ensure good ventilation Make sure that shared spaces in the home have good air flow, such as from an air conditioner or an opened window, weather permitting.  Wash your hands often  Wash your hands often and thoroughly with soap and water for at least 20 seconds. You can use an alcohol based hand sanitizer if soap and water are not available and if your hands are not visibly dirty.  Avoid touching your eyes, nose, and mouth with unwashed hands.  Use disposable paper towels to dry your hands. If not available, use dedicated cloth towels and replace them when they become wet.  Wear a facemask and gloves  Wear a disposable facemask at all times in the room and gloves when you touch or have contact with the patients blood, body fluids, and/or secretions or excretions, such as sweat, saliva, sputum, nasal mucus, vomit, urine, or feces.  Ensure the mask fits over your nose and mouth tightly, and do not touch it during use.  Throw out disposable facemasks and gloves after using them. Do not reuse.  Wash your hands immediately after removing your facemask and gloves.  If your personal clothing becomes contaminated, carefully remove clothing and launder. Wash your hands after handling contaminated clothing.  Place all used disposable facemasks, gloves, and other waste in a lined container before disposing them with other household waste.  Remove gloves and wash your hands immediately after handling these items.  Do not share dishes, glasses, or other household items with the  patient  Avoid sharing household items. You should not share dishes, drinking glasses, cups, eating utensils, towels, bedding, or other items with a patient who is confirmed to have, or being evaluated for, COVID-19 infection.  After the person uses these items, you should wash them thoroughly with soap and water.  Wash laundry thoroughly  Immediately remove and wash clothes or bedding that have blood, body fluids, and/or secretions or excretions, such as sweat, saliva, sputum, nasal mucus, vomit, urine, or feces, on them.  Wear gloves when handling laundry from the patient.  Read and follow directions on labels of laundry or clothing items and detergent. In general, wash and dry with the warmest temperatures recommended on the label.  Clean all areas the individual has used often  Clean all touchable surfaces, such as counters, tabletops, doorknobs, bathroom fixtures, toilets, phones, keyboards, tablets, and bedside tables, every day. Also, clean any surfaces that may have blood, body fluids, and/or secretions or excretions on them.  Wear gloves when cleaning surfaces the patient has come in contact with.  Use a diluted bleach solution (e.g., dilute bleach with 1 part bleach and 10 parts water) or a household disinfectant with a label that says EPA-registered for coronaviruses. To make a bleach solution at home, add 1 tablespoon of bleach to 1 quart (4 cups) of water. For a larger supply, add  cup of bleach to 1 gallon (16 cups) of water.  Read labels of cleaning products and follow recommendations provided on product labels. Labels contain instructions for safe and effective use of the cleaning product including precautions  you should take when applying the product, such as wearing gloves or eye protection and making sure you have good ventilation during use of the product.  Remove gloves and wash hands immediately after cleaning.  Monitor yourself for signs and symptoms of  illness Caregivers and household members are considered close contacts, should monitor their health, and will be asked to limit movement outside of the home to the extent possible. Follow the monitoring steps for close contacts listed on the symptom monitoring form.   ? If you have additional questions, contact your local health department or call the epidemiologist on call at [phone] (available 24/7). ? This guidance is subject to change. For the most up-to-date guidance from [facility], please refer to their website: http://www.martin.com/https://www.cdc.gov/coronavirus/2019-ncov/hcp/guidance-prevent-spread.htmlCOVID-19 Frequently Asked Questions COVID-19 (coronavirus disease) is an infection that is caused by a large family of viruses. Some viruses cause illness in people and others cause illness in animals like camels, cats, and bats. In some cases, the viruses that cause illness in animals can spread to humans. Where did the coronavirus come from? In December 2019, Armeniahina told the [employer] [facility](WHO) of several cases of lung disease (human respiratory illness). These cases were linked to an open seafood and livestock market in the city [place]. The link to the seafood and livestock market suggests that the virus may have spread from animals to humans. However, since that first outbreak in December, the virus has also been shown to spread from person to person. What is the name of the disease and the virus? Disease name Early on, this disease was called novel coronavirus. This is because scientists determined that the disease was caused by a new (novel) respiratory virus. The World Health Organization [facility](WHO) has now named the disease COVID-19, or coronavirus disease. Virus name The virus that causes the disease is called severe acute respiratory syndrome coronavirus 2 (SARS-CoV-2). More information on disease and virus naming World Health Organization [facility](WHO):  www.who.int/emergencies/diseases/novel-coronavirus-2019/technical-guidance/naming-the-coronavirus-disease-(covid-2019)-and-the-virus-that-causes-it Who is at risk for complications from coronavirus disease? Some people may be at higher risk for complications from coronavirus disease. This includes older adults and people who have chronic diseases, such as heart disease, diabetes, and lung disease. If you are at higher risk for complications, take these extra precautions:  Avoid close contact with people who are sick or have a fever or cough. Stay at least 3-6 ft (1-2 m) away from them, if possible.  Wash your hands often with soap and water for at least 20 seconds.  Avoid touching your face, mouth, nose, or eyes.  Keep supplies on hand at home, such as food, medicine, and cleaning supplies.  Stay home as much as possible.  Avoid social gatherings and travel. How does coronavirus disease spread? The virus that causes coronavirus disease spreads easily from person to person (is contagious). There are also cases of community-spread disease. This means the disease has spread to:  People who have no known contact with other infected people.  People who have not traveled to areas where there are known cases. It appears to spread from one person to another through droplets from coughing or sneezing. Can I get the virus from touching surfaces or objects? There is still a lot that we do not know about the virus that causes coronavirus disease. Scientists are basing a lot of information on what they know about similar viruses, such as:  Viruses cannot generally survive on surfaces for long. They need a human body (host) to survive.  It is more  likely that the virus is spread by close contact with people who are sick (direct contact), such as through: ? Shaking hands or hugging. ? Breathing in respiratory droplets that travel through the air. This can happen when an infected person coughs or  sneezes on or near other people.  It is less likely that the virus is spread when a person touches a surface or object that has the virus on it (indirect contact). The virus may be able to enter the body if the person touches a surface or object and then touches his or her face, eyes, nose, or mouth. Can a person spread the virus without having symptoms of the disease? It may be possible for the virus to spread before a person has symptoms of the disease, but this is most likely not the main way the virus is spreading. It is more likely for the virus to spread by being in close contact with people who are sick and breathing in the respiratory droplets of a sick person's cough or sneeze. What are the symptoms of coronavirus disease? Symptoms vary from person to person and can range from mild to severe. Symptoms may include:  Fever.  Cough.  Tiredness, weakness, or fatigue.  Fast breathing or feeling short of breath. These symptoms can appear anywhere from 2 to 14 days after you have been exposed to the virus. If you develop symptoms, call your health care provider. People with severe symptoms may need hospital care. If I am exposed to the virus, how long does it take before symptoms start? Symptoms of coronavirus disease may appear anywhere from 2 to 14 days after a person has been exposed to the virus. If you develop symptoms, call your health care provider. Should I be tested for this virus? Your health care provider will decide whether to test you based on your symptoms, history of exposure, and your risk factors. How does a health care provider test for this virus? Health care providers will collect samples to send for testing. Samples may include:  Taking a swab of fluid from the nose.  Taking fluid from the lungs by having you cough up mucus (sputum) into a sterile cup.  Taking a blood sample.  Taking a stool or urine sample. Is there a treatment or vaccine for this  virus? Currently, there is no vaccine to prevent coronavirus disease. Also, there are no medicines like antibiotics or antivirals to treat the virus. A person who becomes sick is given supportive care, which means rest and fluids. A person may also relieve his or her symptoms by using over-the-counter medicines that treat sneezing, coughing, and runny nose. These are the same medicines that a person takes for the common cold. If you develop symptoms, call your health care provider. People with severe symptoms may need hospital care. What can I do to protect myself and my family from this virus?     You can protect yourself and your family by taking the same actions that you would take to prevent the spread of other viruses. Take the following actions:  Wash your hands often with soap and water for at least 20 seconds. If soap and water are not available, use alcohol-based hand sanitizer.  Avoid touching your face, mouth, nose, or eyes.  Cough or sneeze into a tissue, sleeve, or elbow. Do not cough or sneeze into your hand or the air. ? If you cough or sneeze into a tissue, throw it away immediately and wash your hands.  Disinfect objects and surfaces that you frequently touch every day.  Avoid close contact with people who are sick or have a fever or cough. Stay at least 3-6 ft (1-2 m) away from them, if possible.  Stay home if you are sick, except to get medical care. Call your health care provider before you get medical care.  Make sure your vaccines are up to date. Ask your health care provider what vaccines you need. What should I do if I need to travel? Follow travel recommendations from your local health authority, the CDC, and WHO. Travel information and advice  Centers for Disease Control and Prevention (CDC): GeminiCard.gl  World Health Organization [facility]): PreviewDomains.se Know the  risks and take action to protect your health  You are at higher risk of getting coronavirus disease if you are traveling to areas with an outbreak or if you are exposed to travelers from areas with an outbreak.  Wash your hands often and practice good hygiene to lower the risk of catching or spreading the virus. What should I do if I am sick? General instructions to stop the spread of infection  Wash your hands often with soap and water for at least 20 seconds. If soap and water are not available, use alcohol-based hand sanitizer.  Cough or sneeze into a tissue, sleeve, or elbow. Do not cough or sneeze into your hand or the air.  If you cough or sneeze into a tissue, throw it away immediately and wash your hands.  Stay home unless you must get medical care. Call your health care provider or local health authority before you get medical care.  Avoid public areas. Do not take public transportation, if possible.  If you can, wear a mask if you must go out of the house or if you are in close contact with someone who is not sick. Keep your home clean  Disinfect objects and surfaces that are frequently touched every day. This may include: ? Counters and tables. ? Doorknobs and light switches. ? Sinks and faucets. ? Electronics such as phones, remote controls, keyboards, computers, and tablets.  Wash dishes in hot, soapy water or use a dishwasher. Air-dry your dishes.  Wash laundry in hot water. Prevent infecting other household members  Let healthy household members care for children and pets, if possible. If you have to care for children or pets, wash your hands often and wear a mask.  Sleep in a different bedroom or bed, if possible.  Do not share personal items, such as razors, toothbrushes, deodorant, combs, brushes, towels, and washcloths. Where to find more information Centers for Disease Control and Prevention (CDC)  Information and news updates:  CardRetirement.cz World Health Organization [facility])  Information and news updates: AffordableSalon.es  Coronavirus health topic: https://thompson-craig.com/  Questions and answers on COVID-19: kruiseway.com  Global tracker: who.sprinklr.com American Academy of Pediatrics (AAP)  Information for families: www.healthychildren.org/English/health-issues/conditions/chest-lungs/Pages/2019-Novel-Coronavirus.aspx The coronavirus situation is changing rapidly. Check your local health authority website or the CDC and [facility] websites for updates and news. When should I contact a health care provider?  Contact your health care provider if you have symptoms of an infection, such as fever or cough, and you: ? Have been near anyone who is known to have coronavirus disease. ? Have come into contact with a person who is suspected to have coronavirus disease. ? Have traveled outside of the country. When should I get emergency medical care?  Get help right away by calling your local emergency services (911 in  the [place] if you have: ? Trouble breathing. ? Pain or pressure in your chest. ? Confusion. ? Blue-tinged lips and fingernails. ? Difficulty waking from sleep. ? Symptoms that get worse. Let the emergency medical personnel know if you think you have coronavirus disease. Summary  A new respiratory virus is spreading from person to person and causing COVID-19 (coronavirus disease).  The virus that causes COVID-19 appears to spread easily. It spreads from one person to another through droplets from coughing or sneezing.  Older adults and those with chronic diseases are at higher risk of disease. If you are at higher risk for complications, take extra precautions.  There is currently no vaccine to prevent coronavirus disease. There are no medicines, such as antibiotics or antivirals, to treat the  virus.  You can protect yourself and your family by washing your hands often, avoiding touching your face, and covering your coughs and sneezes. This information is not intended to replace advice given to you by your health care provider. Make sure you discuss any questions you have with your health care provider. Document Released: 04/10/2019 Document Revised: 04/10/2019 Document Reviewed: 04/10/2019 Elsevier Patient Education  2020 Elsevier Inc. COVID-19: How to Protect Yourself and Others Know how it spreads  There is currently no vaccine to prevent coronavirus disease 2019 (COVID-19).  The best way to prevent illness is to avoid being exposed to this virus.  The virus is thought to spread mainly from person-to-person. ? Between people who are in close contact with one another (within about 6 feet). ? Through respiratory droplets produced when an infected person coughs, sneezes or talks. ? These droplets can land in the mouths or noses of people who are nearby or possibly be inhaled into the lungs. ? Some recent studies have suggested that COVID-19 may be spread by people who are not showing symptoms. Everyone should Clean your hands often  Wash your hands often with soap and water for at least 20 seconds especially after you have been in a public place, or after blowing your nose, coughing, or sneezing.  If soap and water are not readily available, use a hand sanitizer that contains at least 60% alcohol. Cover all surfaces of your hands and rub them together until they feel dry.  Avoid touching your eyes, nose, and mouth with unwashed hands. Avoid close contact  Stay home if you are sick.  Avoid close contact with people who are sick.  Put distance between yourself and other people. ? Remember that some people without symptoms may be able to spread virus. ? This is especially important for people who are at higher risk of getting very  RetroStamps.it Cover your mouth and nose with a cloth face cover when around others  You could spread COVID-19 to others even if you do not feel sick.  Everyone should wear a cloth face cover when they have to go out in public, for example to the grocery store or to pick up other necessities. ? Cloth face coverings should not be placed on young children under age 53, anyone who has trouble breathing, or is unconscious, incapacitated or otherwise unable to remove the mask without assistance.  The cloth face cover is meant to protect other people in case you are infected.  Do NOT use a facemask meant for a Research scientist (physical sciences).  Continue to keep about 6 feet between yourself and others. The cloth face cover is not a substitute for social distancing. Cover coughs and sneezes  If you are  in a private setting and do not have on your cloth face covering, remember to always cover your mouth and nose with a tissue when you cough or sneeze or use the inside of your elbow.  Throw used tissues in the trash.  Immediately wash your hands with soap and water for at least 20 seconds. If soap and water are not readily available, clean your hands with a hand sanitizer that contains at least 60% alcohol. Clean and disinfect  Clean AND disinfect frequently touched surfaces daily. This includes tables, doorknobs, light switches, countertops, handles, desks, phones, keyboards, toilets, faucets, and sinks. ktimeonline.com  If surfaces are dirty, clean them: Use detergent or soap and water prior to disinfection.  Then, use a household disinfectant. You can see a list of EPA-registered household disinfectants here. SouthAmericaFlowers.co.uk 05/01/2019 This information is not intended to replace advice given to you by your health care provider. Make sure you discuss any questions you  have with your health care provider. Document Released: 04/10/2019 Document Revised: 05/09/2019 Document Reviewed: 04/10/2019 Elsevier Patient Education  2020 [employer].

## 2019-08-21 NOTE — Progress Notes (Signed)
Virtual Visit via Telephone Note  I connected with Robert Bautista on 08/21/19 at  2:30 PM EDT by telephone and verified that I am speaking with the correct person using two identifiers.  Location: Patient: at home  Provider: [facility], [address] [facility], [place]    I discussed the limitations, risks, security and privacy concerns of performing an evaluation and management service by telephone and the availability of in person appointments. I also discussed with the patient that there may be a patient responsible charge related to this service. The patient expressed understanding and agreed to proceed.   History of Present Illness: Allergies  Allergen Reactions  . Sulfa Antibiotics Rash    Patient is a 33 year old male in no acute distress who calls the clinic for a virtual telephone visit during the Covid 19 pandemic.  Onset symptoms 08/18/2019, cough. Fever started today 08/21/19 100.8 per patient started this morning.  He has productive cough, he has not looked at it " note enough to spit out I have just been swallowing it". No recent illness or hospitalization.  Denies any shortness of breath. He had mild chills and mild body aches this morning.  Temperature 98.9. Tylenol  1000 mg last at 2 pm today  He denies any shortness of breath or distress,   He takes Zyrtec everyday.   [place]    Using Mucinex D and Tylenol as needed. Tylenol was first started today 08/21/19 two doses today.   Patient  denies any  rash, chest pain, shortness of breath, nausea, vomiting, or diarrhea.   Observations/Objective:   Patient is alert and oriented and responsive to questions Engages in conversation with provider. Speaks in full sentences without any pauses without any shortness of breath or distress.   He speaks 15 plus words without any pauses or sounds of distress.  Assessment and Plan: 1. Suspected Covid-19  Virus Infection   2. Educated About Covid-19 Virus Infection   3. Cough      Follow Up Instructions: Do not take tylenol around the clock let it wear off for 2- 3 hours and take temperature prior not to mask any fever.   Meds ordered this encounter  Medications  . clarithromycin (BIAXIN) 500 MG tablet    Sig: Take 1 tablet (500 mg total) by mouth 2 (two) times daily.    Dispense:  14 tablet    Refill:  0  . albuterol (VENTOLIN HFA) 108 (90 Base) MCG/ACT inhaler    Sig: Inhale 1-2 puffs into the lungs every 6 (six) hours as needed for wheezing or shortness of breath.    Dispense:  8 g    Refill:  0  . benzonatate (TESSALON) 100 MG capsule    Sig: Take 1 capsule (100 mg total) by mouth 2 (two) times daily as needed for cough (will cause drowsiness).    Dispense:  20 capsule    Refill:  0   COVID test today 08/21/19. Reviewed medications above.  Seek care at primary care id symptoms persist. RED FLAGS discussed and when to seek emergency care.   Advised patient call the office or your primary care doctor for an appointment if no improvement within 72 hours or if any symptoms change or worsen at any time  Advised ER or urgent Care if after hours or on weekend. Call 911 for emergency symptoms at any time.Patinet verbalized understanding of all instructions given/reviewed and treatment plan and  has no further questions or concerns at this time.     I discussed the assessment and treatment plan with the patient. The patient was provided an opportunity to ask questions and all were answered. The patient agreed with the plan and demonstrated an understanding of the instructions.   The patient was advised to call back or seek an in-person evaluation if the symptoms worsen or if the condition fails to improve as anticipated.  I provided 19 minutes of non-face-to-face time during this encounter.   Marcille Buffy, FNP

## 2019-08-22 ENCOUNTER — Other Ambulatory Visit: Payer: Self-pay

## 2019-08-22 DIAGNOSIS — Z20822 Contact with and (suspected) exposure to covid-19: Secondary | ICD-10-CM

## 2019-08-23 LAB — SPECIMEN STATUS REPORT

## 2019-08-23 LAB — NOVEL CORONAVIRUS, NAA: SARS-CoV-2, NAA: DETECTED — AB

## 2019-08-24 ENCOUNTER — Telehealth: Payer: Self-pay | Admitting: *Deleted

## 2019-08-24 NOTE — Telephone Encounter (Signed)
See Result note.   Opened by mistake.   Was given positive COVID-19 result and instructions.

## 2020-02-25 ENCOUNTER — Other Ambulatory Visit: Payer: Self-pay | Admitting: Family Medicine

## 2020-02-25 DIAGNOSIS — N63 Unspecified lump in unspecified breast: Secondary | ICD-10-CM

## 2020-03-06 ENCOUNTER — Ambulatory Visit
Admission: RE | Admit: 2020-03-06 | Discharge: 2020-03-06 | Disposition: A | Discharge status: Home / Self Care | Payer: Managed Care, Other (non HMO) | Source: Ambulatory Visit | Attending: Family Medicine | Admitting: Family Medicine

## 2020-03-06 DIAGNOSIS — N63 Unspecified lump in unspecified breast: Secondary | ICD-10-CM

## 2020-03-25 ENCOUNTER — Encounter: Payer: Self-pay | Admitting: General Surgery

## 2020-03-25 ENCOUNTER — Other Ambulatory Visit: Payer: Self-pay

## 2020-03-25 ENCOUNTER — Ambulatory Visit (INDEPENDENT_AMBULATORY_CARE_PROVIDER_SITE_OTHER): Payer: Managed Care, Other (non HMO) | Admitting: General Surgery

## 2020-03-25 VITALS — BP 136/86 | HR 105 | Temp 97.3°F | Ht 68.0 in | Wt 185.2 lb

## 2020-03-25 DIAGNOSIS — N6320 Unspecified lump in the left breast, unspecified quadrant: Secondary | ICD-10-CM | POA: Diagnosis not present

## 2020-03-25 NOTE — Patient Instructions (Addendum)
Our surgery scheduler Kennyth Arnold will contact you within the next 24-48 hrs. During the call, Kennyth Arnold will discuss the preparation prior to surgery. Kennyth Arnold will also discuss the different dates and times for surgery. Please have the BLUE sheet available when she contacts you. If you have any questions or concerns, please feel free to contact our office.   Lumpectomy A lumpectomy, sometimes called a partial mastectomy, is surgery to remove a cancerous tumor or mass (the lump) from a breast. It is a form of breast-conserving or breast-preservation surgery. This means that the cancerous tissue is removed but the breast remains intact. During a lumpectomy, the portion of the breast that contains the tumor is removed. Some normal tissue around the lump may be taken out to make sure that all of the tumor has been removed. Lymph nodes under your arm may also be removed and tested to find out if the cancer has spread. Lymph nodes are part of the body's disease-fighting system (immune system) and are usually the first place where breast cancer spreads. Tell a health care provider about:  Any allergies you have.  All medicines you are taking, including vitamins, herbs, eye drops, creams, and over-the-counter medicines.  Any problems you or family members have had with anesthetic medicines.  Any blood disorders you have.  Any surgeries you have had.  Any medical conditions you have.  Whether you are pregnant or may be pregnant. What are the risks? Generally, this is a safe procedure. However, problems may occur, including:  Bleeding.  Infection.  Allergic reaction to medicines.  Pain, swelling, weakness, or numbness in the arm on the side of your surgery.  Temporary swelling.  Change in the shape of the breast, particularly if a large portion is removed.  Scar tissue that forms at the surgical site and feels hard to the touch.  Blood clots. What happens before the procedure? Staying  hydrated Follow instructions from your health care provider about hydration, which may include:  Up to 2 hours before the procedure - you may continue to drink clear liquids, such as water, clear fruit juice, black coffee, and plain tea.  Eating and drinking restrictions Follow instructions from your health care provider about eating and drinking, which may include:  8 hours before the procedure - stop eating heavy meals or foods, such as meat, fried foods, or fatty foods.  6 hours before the procedure - stop eating light meals or foods, such as toast or cereal.  6 hours before the procedure - stop drinking milk or drinks that contain milk.  2 hours before the procedure - stop drinking clear liquids. Medicines Ask your health care provider about:  Changing or stopping your regular medicines. This is especially important if you are taking diabetes medicines or blood thinners.  Taking medicines such as aspirin and ibuprofen. These medicines can thin your blood. Do not take these medicines unless your health care provider tells you to take them.  Taking over-the-counter medicines, vitamins, herbs, and supplements. General instructions  Prior to surgery, your health care provider may do a procedure to locate and mark the tumor area in your breast (localization). This will help guide your surgeon to where the incision will be made. This may be done with: ? Imaging, such as a mammogram, ultrasound, or MRI. ? Insertion of a small wire, clip, or seed, or an implant that will reflect a radar signal.  You may have screening tests or exams to get baseline measurements of your arm. These  can be compared to measurements done after surgery to monitor for swelling (lymphedema) that can develop after having lymph nodes removed.  Ask your health care provider: ? How your surgery site will be marked. ? What steps will be taken to help prevent infection. These may include:  Washing skin with a  germ-killing soap.  Taking antibiotic medicine.  Plan to have someone take you home from the hospital or clinic.  Plan to have a responsible adult care for you for at least 24 hours after you leave the hospital or clinic. This is important. What happens during the procedure?   An IV will be inserted into one of your veins.  You will be given one or more of the following: ? A medicine to help you relax (sedative). ? A medicine to numb the area (local anesthetic). ? A medicine to make you fall asleep (general anesthetic).  Your health care provider will use a kind of electric scalpel that uses heat to reduce bleeding (electrocautery knife). A curved incision that follows the natural curve of your breast will be made. This type of incision will allow for minimal scarring and better healing.  The tumor will be removed along with some of the tissue around it. This will be sent to the lab for testing. Your health care provider may also remove lymph nodes at this time if needed.  If the tumor is close to the muscles over your chest, some muscle tissue may also be removed.  A small drain tube may be inserted into your breast area or armpit to collect fluid that may build up after surgery. This tube will be connected to a suction bulb on the outside of your body to remove the fluid.  The incision will be closed with stitches (sutures).  A bandage (dressing) may be placed over the incision. The procedure may vary among health care providers and hospitals. What happens after the procedure?  Your blood pressure, heart rate, breathing rate, and blood oxygen level will be monitored until you leave the hospital or clinic.  You will be given medicine for pain as needed.  Your IV will be removed when you are able to eat and drink by mouth.  You will be encouraged to get up and walk as soon as you can. This is important to improve blood flow and breathing. Ask for help if you feel weak or  unsteady.  You may have: ? A drain tube in place for 2-3 days to prevent a collection of blood (hematoma) from developing in the breast. You will be given instructions about caring for the drain before you go home. ? A pressure bandage applied for 1-2 days to prevent bleeding or swelling. Your pressure bandage may look like a thick piece of fabric or an elastic wrap. Ask your health care provider how to care for your bandage at home.  You may be given a tight sleeve to wear over your arm on the side of your surgery. You should wear this sleeve as told by your health care provider.  Do not drive for 24 hours if you were given a sedative during your procedure. Summary  A lumpectomy, sometimes called a partial mastectomy, is surgery to remove a cancerous tumor or mass (the lump) from a breast.  During a lumpectomy, the portion of the breast that contains the tumor is removed. Lymph nodes under your arm may also be removed and tested to find out if the cancer has spread.  Plan to  have someone take you home from the hospital or clinic.  You may have a drain tube in place for 2-3 days to prevent a collection of blood (hematoma) from developing in the breast. You will be given instructions about caring for the drain before you go home. This information is not intended to replace advice given to you by your health care provider. Make sure you discuss any questions you have with your health care provider. Document Revised: 06/18/2019 Document Reviewed: 06/18/2019 Elsevier Patient Education  [employer].

## 2020-03-25 NOTE — H&P (View-Only) (Signed)
Patient ID: Robert Bautista, male   DOB: 06/11/1986, 33 y.o.   MRN: [id number]  Chief Complaint  Patient presents with  . New Patient (Initial Visit)    New pt ref Dr.Katherine Bliss left breast mass    HPI Robert Bautista is a 33 y.o. male.   He reports that about 2 months ago, he first noticed an area of swelling behind his left nipple.  At that time, it was about the size of a BB.  It has grown and is more painful now.  It is particularly painful with any manipulation.  He says that if he wears one of his work shirts and has pins in that pocket, that rubbing causes worsening irritation.  He denies any nipple discharge or drainage.  No history of anabolic steroid use; no history of marijuana use.  He denies any family history of similar findings.  No history of trauma to the area.  A mammogram was done on March 11 which was consistent with gynecomastia.  He is here today to discuss surgical excision.   Past Medical History:  Diagnosis Date  . GERD (gastroesophageal reflux disease)   . PAT (paroxysmal atrial tachycardia) ([facility]CC)     Past Surgical History:  Procedure Laterality Date  . COLONOSCOPY    . COLONOSCOPY WITH PROPOFOL N/A 07/05/2017   Procedure: COLONOSCOPY WITH PROPOFOL;  Surgeon: Skulskie, Martin U, MD;  Location: [facility]OSCOPY;  Service: Endoscopy;  Laterality: N/A;    Family History  Problem Relation Age of Onset  . Diabetes Mother   . Stroke Father     Social History Social History   Tobacco Use  . Smoking status: Never Smoker  . Smokeless tobacco: Never Used  Substance Use Topics  . Alcohol use: No  . Drug use: No    Allergies  Allergen Reactions  . Sulfa Antibiotics Rash    Current Outpatient Medications  Medication Sig Dispense Refill  . albuterol (VENTOLIN HFA) 108 (90 Base) MCG/ACT inhaler Inhale 1-2 puffs into the lungs every 6 (six) hours as needed for wheezing or shortness of breath. (Patient not taking: Reported on 03/25/2020) 8 g 0  .  benzonatate (TESSALON) 100 MG capsule Take 1 capsule (100 mg total) by mouth 2 (two) times daily as needed for cough (will cause drowsiness). (Patient not taking: Reported on 03/25/2020) 20 capsule 0  . clarithromycin (BIAXIN) 500 MG tablet Take 1 tablet (500 mg total) by mouth 2 (two) times daily. (Patient not taking: Reported on 03/25/2020) 14 tablet 0   No current facility-administered medications for this visit.    Review of Systems Review of Systems  All other systems reviewed and are negative.   Blood pressure 136/86, pulse (!) 105, temperature (!) 97.3 F (36.3 C), temperature source Temporal, height 5' 8" (1.727 m), weight 185 lb 3.2 oz (84 kg), SpO2 97 %.  Physical Exam Physical Exam Constitutional:      General: He is not in acute distress.    Appearance: Normal appearance. He is normal weight.  HENT:     Head: Normocephalic and atraumatic.     Nose:     Comments: Covered with a mask secondary to COVID-19 precautions    Mouth/Throat:     Comments: Covered with a mask secondary to COVID-19 precautions Eyes:     General: No scleral icterus.       Right eye: No discharge.        Left eye: No discharge.     Conjunctiva/sclera: Conjunctivae normal.       Comments: Wearing glasses.  Neck:     Comments: No palpable thyromegaly or dominant thyroid masses appreciated.  The gland moves freely with deglutition. Cardiovascular:     Rate and Rhythm: Regular rhythm. Tachycardia present.     Pulses: Normal pulses.     Heart sounds: Normal heart sounds.  Pulmonary:     Effort: Pulmonary effort is normal.     Breath sounds: Normal breath sounds.  Chest:     Breasts:        Left: Mass present.     Comments: Directly retroareolar, there is an approximately 0.5 cm rubbery mobile mass.  It is tender to manipulation. Abdominal:     General: Abdomen is flat. Bowel sounds are normal.     Palpations: Abdomen is soft.     Tenderness: There is no abdominal tenderness.  Genitourinary:     Comments: Deferred Musculoskeletal:        General: No swelling or deformity.     Cervical back: No rigidity.  Lymphadenopathy:     Cervical: No cervical adenopathy.  Skin:    General: Skin is warm and dry.  Neurological:     General: No focal deficit present.     Mental Status: He is alert and oriented to person, place, and time.  Psychiatric:        Mood and Affect: Mood normal.        Behavior: Behavior normal.     Data Reviewed I reviewed the mammographic findings.  I have copied the radiologist report here: CLINICAL DATA:  Palpable lump in the left retroareolar region.  EXAM: DIGITAL DIAGNOSTIC BILATERAL MAMMOGRAM WITH CAD AND TOMO  COMPARISON:  Previous exam(s).  ACR Breast Density Category a: The breast tissue is almost entirely fatty.  FINDINGS: The patient has left-sided gynecomastia. No other suspicious findings are seen in either breast.  Mammographic images were processed with CAD.  IMPRESSION: The patient is palpating mild left gynecomastia. No other suspicious findings.  RECOMMENDATION: Treatment of the patient's gynecomastia should be based on clinical and physical exam given lack of imaging findings.  I have discussed the findings and recommendations with the patient. If applicable, a reminder letter will be sent to the patient regarding the next appointment.  BI-RADS CATEGORY  2: Benign.  Assessment This is a 34 year old man with a 64-month history of a left retroareolar mass.  The radiographic findings suggest gynecomastia, however on palpation, the tissue is more firm than I have typically experienced with gynecomastia.    Plan I have offered him resection of the mass.  I discussed the risks of the procedure with him.  These include, but are not limited to, bleeding, infection, unanticipated pathologic findings warranting additional surgery or treatment, recurrence, hematoma, seroma, ischemia to the overlying nipple or skin which could  necessitate further procedures.  He and his wife had the opportunity to ask any questions and these were answered to their satisfaction.  We will work on getting him scheduled for his procedure.    Duanne Guess 03/25/2020, 3:53 PM

## 2020-03-25 NOTE — Progress Notes (Signed)
Patient ID: Robert Bautista, male   DOB: November 01, 1986, 34 y.o.   MRN: [id number]  Chief Complaint  Patient presents with  . New Patient (Initial Visit)    New pt ref Dr.Katherine Bliss left breast mass    HPI Robert Bautista is a 34 y.o. male.   He reports that about 2 months ago, he first noticed an area of swelling behind his left nipple.  At that time, it was about the size of a BB.  It has grown and is more painful now.  It is particularly painful with any manipulation.  He says that if he wears one of his work shirts and has pins in that pocket, that rubbing causes worsening irritation.  He denies any nipple discharge or drainage.  No history of anabolic steroid use; no history of marijuana use.  He denies any family history of similar findings.  No history of trauma to the area.  A mammogram was done on March 11 which was consistent with gynecomastia.  He is here today to discuss surgical excision.   Past Medical History:  Diagnosis Date  . GERD (gastroesophageal reflux disease)   . PAT (paroxysmal atrial tachycardia) ([place])     Past Surgical History:  Procedure Laterality Date  . COLONOSCOPY    . COLONOSCOPY WITH PROPOFOL N/A 07/05/2017   Procedure: COLONOSCOPY WITH PROPOFOL;  Surgeon: Lollie Sails, MD;  Location: [facility];  Service: Endoscopy;  Laterality: N/A;    Family History  Problem Relation Age of Onset  . Diabetes Mother   . Stroke Father     Social History Social History   Tobacco Use  . Smoking status: Never Smoker  . Smokeless tobacco: Never Used  Substance Use Topics  . Alcohol use: No  . Drug use: No    Allergies  Allergen Reactions  . Sulfa Antibiotics Rash    Current Outpatient Medications  Medication Sig Dispense Refill  . albuterol (VENTOLIN HFA) 108 (90 Base) MCG/ACT inhaler Inhale 1-2 puffs into the lungs every 6 (six) hours as needed for wheezing or shortness of breath. (Patient not taking: Reported on 03/25/2020) 8 g 0  .  benzonatate (TESSALON) 100 MG capsule Take 1 capsule (100 mg total) by mouth 2 (two) times daily as needed for cough (will cause drowsiness). (Patient not taking: Reported on 03/25/2020) 20 capsule 0  . clarithromycin (BIAXIN) 500 MG tablet Take 1 tablet (500 mg total) by mouth 2 (two) times daily. (Patient not taking: Reported on 03/25/2020) 14 tablet 0   No current facility-administered medications for this visit.    Review of Systems Review of Systems  All other systems reviewed and are negative.   Blood pressure 136/86, pulse (!) 105, temperature (!) 97.3 F (36.3 C), temperature source Temporal, height 5\' 8"  (1.727 m), weight 185 lb 3.2 oz (84 kg), SpO2 97 %.  Physical Exam Physical Exam Constitutional:      General: He is not in acute distress.    Appearance: Normal appearance. He is normal weight.  HENT:     Head: Normocephalic and atraumatic.     Nose:     Comments: Covered with a mask secondary to COVID-19 precautions    Mouth/Throat:     Comments: Covered with a mask secondary to COVID-19 precautions Eyes:     General: No scleral icterus.       Right eye: No discharge.        Left eye: No discharge.     Conjunctiva/sclera: Conjunctivae normal.  Comments: Wearing glasses.  Neck:     Comments: No palpable thyromegaly or dominant thyroid masses appreciated.  The gland moves freely with deglutition. Cardiovascular:     Rate and Rhythm: Regular rhythm. Tachycardia present.     Pulses: Normal pulses.     Heart sounds: Normal heart sounds.  Pulmonary:     Effort: Pulmonary effort is normal.     Breath sounds: Normal breath sounds.  Chest:     Breasts:        Left: Mass present.     Comments: Directly retroareolar, there is an approximately 0.5 cm rubbery mobile mass.  It is tender to manipulation. Abdominal:     General: Abdomen is flat. Bowel sounds are normal.     Palpations: Abdomen is soft.     Tenderness: There is no abdominal tenderness.  Genitourinary:     Comments: Deferred Musculoskeletal:        General: No swelling or deformity.     Cervical back: No rigidity.  Lymphadenopathy:     Cervical: No cervical adenopathy.  Skin:    General: Skin is warm and dry.  Neurological:     General: No focal deficit present.     Mental Status: He is alert and oriented to person, place, and time.  Psychiatric:        Mood and Affect: Mood normal.        Behavior: Behavior normal.     Data Reviewed I reviewed the mammographic findings.  I have copied the radiologist report here: CLINICAL DATA:  Palpable lump in the left retroareolar region.  EXAM: DIGITAL DIAGNOSTIC BILATERAL MAMMOGRAM WITH CAD AND TOMO  COMPARISON:  Previous exam(s).  ACR Breast Density Category a: The breast tissue is almost entirely fatty.  FINDINGS: The patient has left-sided gynecomastia. No other suspicious findings are seen in either breast.  Mammographic images were processed with CAD.  IMPRESSION: The patient is palpating mild left gynecomastia. No other suspicious findings.  RECOMMENDATION: Treatment of the patient's gynecomastia should be based on clinical and physical exam given lack of imaging findings.  I have discussed the findings and recommendations with the patient. If applicable, a reminder letter will be sent to the patient regarding the next appointment.  BI-RADS CATEGORY  2: Benign.  Assessment This is a 34 year old man with a 64-month history of a left retroareolar mass.  The radiographic findings suggest gynecomastia, however on palpation, the tissue is more firm than I have typically experienced with gynecomastia.    Plan I have offered him resection of the mass.  I discussed the risks of the procedure with him.  These include, but are not limited to, bleeding, infection, unanticipated pathologic findings warranting additional surgery or treatment, recurrence, hematoma, seroma, ischemia to the overlying nipple or skin which could  necessitate further procedures.  He and his wife had the opportunity to ask any questions and these were answered to their satisfaction.  We will work on getting him scheduled for his procedure.    Duanne Guess 03/25/2020, 3:53 PM

## 2020-03-28 ENCOUNTER — Telehealth: Payer: Self-pay | Admitting: General Surgery

## 2020-03-28 NOTE — Telephone Encounter (Signed)
Pt's spouse, [place], has been advised of Pre-Admission date/time, COVID Testing date and Surgery date.  Surgery Date: 04/04/20 Preadmission Testing Date: 04/01/20 (phone 8a-1p) Covid Testing Date: 04/02/20 - patient [facility] to [facility] ([address] [facility]) between 8a-1p  Patient has been made aware to call [phone], between 1-3:00pm the day before surgery, to find out what time to arrive for surgery.

## 2020-04-01 ENCOUNTER — Encounter
Admission: RE | Admit: 2020-04-01 | Discharge: 2020-04-01 | Disposition: A | Discharge status: Home / Self Care | Payer: Managed Care, Other (non HMO) | Source: Ambulatory Visit | Attending: General Surgery | Admitting: General Surgery

## 2020-04-01 ENCOUNTER — Other Ambulatory Visit: Payer: Self-pay

## 2020-04-01 DIAGNOSIS — Z01818 Encounter for other preprocedural examination: Secondary | ICD-10-CM | POA: Diagnosis present

## 2020-04-01 NOTE — Patient Instructions (Signed)
  Your procedure is scheduled on: Friday April 04, 2020 Report to Day Surgery. To find out your arrival time please call [phone] between 1PM - 3PM on Thursday April 03, 2020.  Remember: Instructions that are not followed completely may result in serious medical risk,  up to and including death, or upon the discretion of your surgeon and anesthesiologist your  surgery may need to be rescheduled.     _X__ 1. Do not eat food after midnight the night before your procedure.                 No gum chewing or hard candies. You may drink clear liquids up to 2 hours                 before you are scheduled to arrive for your surgery- DO not drink clear                 liquids within 2 hours of the start of your surgery.                 Clear Liquids include:  water, apple juice without pulp, clear Gatorade, G2 or                  Gatorade Zero (avoid Red/Purple/Blue), Black Coffee or Tea (Do not add                 anything to coffee or tea).  __X__2.  On the morning of surgery brush your teeth with toothpaste and water, you                may rinse your mouth with mouthwash if you wish.  Do not swallow any toothpaste of mouthwash.     _X__ 3.  No Alcohol for 24 hours before or after surgery.   _X__ 4.  Do Not Smoke or use e-cigarettes For 24 Hours Prior to Your Surgery.                 Do not use any chewable tobacco products for at least 6 hours prior to                 surgery.  __x__  5.  Notify your doctor if there is any change in your medical condition      (cold, fever, infections).     Do not wear jewelry, make-up, hairpins, clips or nail polish. Do not wear lotions, powders, or perfumes. You may wear deodorant. Do not shave 48 hours prior to surgery. Men may shave face and neck. Do not bring valuables to the hospital.    [facility] is not responsible for any belongings or valuables.  Contacts, dentures or bridgework may not be worn into surgery. Leave  your suitcase in the car. After surgery it may be brought to your room. For patients admitted to the hospital, discharge time is determined by your treatment team.   Patients discharged the day of surgery will not be allowed to drive home.   Make arrangements for someone to be with you for the first 24 hours of your Same Day Discharge.  ____ Take these medicines the morning of surgery with A SIP OF WATER:    1. none   __x__ Use CHG Soap as directed  __x__ Stop Anti-inflammatories on    __x__ Stop supplements until after surgery.    __x__ Do not add any herbal supplements before your surgery.

## 2020-04-02 ENCOUNTER — Other Ambulatory Visit
Admission: RE | Admit: 2020-04-02 | Discharge: 2020-04-02 | Disposition: A | Discharge status: Home / Self Care | Payer: Managed Care, Other (non HMO) | Source: Ambulatory Visit | Attending: General Surgery | Admitting: General Surgery

## 2020-04-02 DIAGNOSIS — Z01812 Encounter for preprocedural laboratory examination: Secondary | ICD-10-CM | POA: Insufficient documentation

## 2020-04-02 DIAGNOSIS — Z20822 Contact with and (suspected) exposure to covid-19: Secondary | ICD-10-CM | POA: Diagnosis not present

## 2020-04-02 LAB — SARS CORONAVIRUS 2 (TAT 6-24 HRS): SARS Coronavirus 2: NEGATIVE

## 2020-04-04 ENCOUNTER — Ambulatory Visit: Payer: Managed Care, Other (non HMO) | Admitting: Registered Nurse

## 2020-04-04 ENCOUNTER — Encounter
Admission: RE | Disposition: A | Discharge status: Home / Self Care | Payer: Self-pay | Source: Home / Self Care | Attending: General Surgery

## 2020-04-04 ENCOUNTER — Ambulatory Visit
Admission: RE | Admit: 2020-04-04 | Discharge: 2020-04-04 | Disposition: A | Discharge status: Home / Self Care | Payer: Managed Care, Other (non HMO) | Attending: General Surgery | Admitting: General Surgery

## 2020-04-04 ENCOUNTER — Encounter: Payer: Self-pay | Admitting: General Surgery

## 2020-04-04 ENCOUNTER — Other Ambulatory Visit: Payer: Self-pay

## 2020-04-04 DIAGNOSIS — N6489 Other specified disorders of breast: Secondary | ICD-10-CM | POA: Insufficient documentation

## 2020-04-04 DIAGNOSIS — N6320 Unspecified lump in the left breast, unspecified quadrant: Secondary | ICD-10-CM | POA: Diagnosis not present

## 2020-04-04 DIAGNOSIS — N62 Hypertrophy of breast: Secondary | ICD-10-CM | POA: Insufficient documentation

## 2020-04-04 HISTORY — PX: GYNECOMASTIA MASTECTOMY: SHX5265

## 2020-04-04 SURGERY — MASTECTOMY, FOR GYNECOMASTIA
Anesthesia: General | Site: Breast | Laterality: Left

## 2020-04-04 MED ORDER — GABAPENTIN 300 MG PO CAPS: 300.0000 mg | ORAL_CAPSULE | ORAL | Status: AC

## 2020-04-04 MED ORDER — CHLORHEXIDINE GLUCONATE CLOTH 2 % EX PADS: 6.0000 | MEDICATED_PAD | Freq: Once | CUTANEOUS | Status: DC

## 2020-04-04 MED ORDER — CEFAZOLIN SODIUM-DEXTROSE 2-4 GM/100ML-% IV SOLN
INTRAVENOUS | Status: AC
  Filled 2020-04-04: qty 100

## 2020-04-04 MED ORDER — DEXMEDETOMIDINE HCL IN NACL 200 MCG/50ML IV SOLN
INTRAVENOUS | Status: AC
  Filled 2020-04-04: qty 50

## 2020-04-04 MED ORDER — FAMOTIDINE 20 MG PO TABS: 20.0000 mg | ORAL_TABLET | Freq: Once | ORAL | Status: AC

## 2020-04-04 MED ORDER — PROPOFOL 10 MG/ML IV BOLUS
INTRAVENOUS | Status: DC | PRN
  Administered 2020-04-04: 200 mg via INTRAVENOUS

## 2020-04-04 MED ORDER — GABAPENTIN 300 MG PO CAPS
ORAL_CAPSULE | ORAL | Status: AC
  Administered 2020-04-04: 300 mg via ORAL
  Filled 2020-04-04: qty 1

## 2020-04-04 MED ORDER — EPHEDRINE 5 MG/ML INJ
INTRAVENOUS | Status: AC
  Filled 2020-04-04: qty 10

## 2020-04-04 MED ORDER — PROPOFOL 10 MG/ML IV BOLUS
INTRAVENOUS | Status: AC
  Filled 2020-04-04: qty 20

## 2020-04-04 MED ORDER — DEXAMETHASONE SODIUM PHOSPHATE 10 MG/ML IJ SOLN
INTRAMUSCULAR | Status: DC | PRN
  Administered 2020-04-04: 8 mg via INTRAVENOUS

## 2020-04-04 MED ORDER — PROMETHAZINE HCL 25 MG/ML IJ SOLN: 6.2500 mg | INTRAMUSCULAR | Status: DC | PRN

## 2020-04-04 MED ORDER — BUPIVACAINE LIPOSOME 1.3 % IJ SUSP: 20.0000 mL | Freq: Once | INTRAMUSCULAR | Status: DC

## 2020-04-04 MED ORDER — MIDAZOLAM HCL 2 MG/2ML IJ SOLN
INTRAMUSCULAR | Status: AC
  Filled 2020-04-04: qty 2

## 2020-04-04 MED ORDER — FENTANYL CITRATE (PF) 100 MCG/2ML IJ SOLN
INTRAMUSCULAR | Status: DC | PRN
  Administered 2020-04-04: 50 ug via INTRAVENOUS

## 2020-04-04 MED ORDER — ACETAMINOPHEN 500 MG PO TABS: 1000.0000 mg | ORAL_TABLET | ORAL | Status: AC

## 2020-04-04 MED ORDER — LIDOCAINE HCL (CARDIAC) PF 100 MG/5ML IV SOSY
PREFILLED_SYRINGE | INTRAVENOUS | Status: DC | PRN
  Administered 2020-04-04: 20 mg via INTRAVENOUS
  Administered 2020-04-04: 80 mg via INTRAVENOUS

## 2020-04-04 MED ORDER — FENTANYL CITRATE (PF) 100 MCG/2ML IJ SOLN: 25.0000 ug | INTRAMUSCULAR | Status: DC | PRN

## 2020-04-04 MED ORDER — MIDAZOLAM HCL 2 MG/2ML IJ SOLN
INTRAMUSCULAR | Status: DC | PRN
  Administered 2020-04-04: 2 mg via INTRAVENOUS

## 2020-04-04 MED ORDER — FENTANYL CITRATE (PF) 100 MCG/2ML IJ SOLN
INTRAMUSCULAR | Status: AC
  Filled 2020-04-04: qty 2

## 2020-04-04 MED ORDER — FAMOTIDINE 20 MG PO TABS
ORAL_TABLET | ORAL | Status: AC
  Administered 2020-04-04: 20 mg via ORAL
  Filled 2020-04-04: qty 1

## 2020-04-04 MED ORDER — PHENYLEPHRINE HCL (PRESSORS) 10 MG/ML IV SOLN
INTRAVENOUS | Status: DC | PRN
  Administered 2020-04-04 (×3): 100 ug via INTRAVENOUS

## 2020-04-04 MED ORDER — ACETAMINOPHEN 500 MG PO TABS
ORAL_TABLET | ORAL | Status: AC
  Administered 2020-04-04: 1000 mg via ORAL
  Filled 2020-04-04: qty 2

## 2020-04-04 MED ORDER — LIDOCAINE-EPINEPHRINE 1 %-1:100000 IJ SOLN
INTRAMUSCULAR | Status: AC
  Filled 2020-04-04: qty 1

## 2020-04-04 MED ORDER — DEXAMETHASONE SODIUM PHOSPHATE 10 MG/ML IJ SOLN
INTRAMUSCULAR | Status: AC
  Filled 2020-04-04: qty 1

## 2020-04-04 MED ORDER — EPHEDRINE SULFATE 50 MG/ML IJ SOLN
INTRAMUSCULAR | Status: DC | PRN
  Administered 2020-04-04 (×3): 10 mg via INTRAVENOUS

## 2020-04-04 MED ORDER — HYDROCODONE-ACETAMINOPHEN 5-325 MG PO TABS: 1.0000 | ORAL_TABLET | Freq: Four times a day (QID) | ORAL | 0 refills | Status: AC | PRN

## 2020-04-04 MED ORDER — KETAMINE HCL 10 MG/ML IJ SOLN
INTRAMUSCULAR | Status: DC | PRN
  Administered 2020-04-04: 30 mg via INTRAVENOUS

## 2020-04-04 MED ORDER — CEFAZOLIN SODIUM-DEXTROSE 2-4 GM/100ML-% IV SOLN
2.0000 g | INTRAVENOUS | Status: AC
  Administered 2020-04-04: 2 g via INTRAVENOUS

## 2020-04-04 MED ORDER — LIDOCAINE-EPINEPHRINE 1 %-1:100000 IJ SOLN
INTRAMUSCULAR | Status: DC | PRN
  Administered 2020-04-04: 4 mL

## 2020-04-04 MED ORDER — ONDANSETRON HCL 4 MG/2ML IJ SOLN
INTRAMUSCULAR | Status: AC
  Filled 2020-04-04: qty 2

## 2020-04-04 MED ORDER — BUPIVACAINE HCL (PF) 0.25 % IJ SOLN
INTRAMUSCULAR | Status: AC
  Filled 2020-04-04: qty 30

## 2020-04-04 MED ORDER — LACTATED RINGERS IV SOLN: INTRAVENOUS | Status: DC

## 2020-04-04 MED ORDER — DEXMEDETOMIDINE HCL 200 MCG/2ML IV SOLN
INTRAVENOUS | Status: DC | PRN
  Administered 2020-04-04: 8 ug via INTRAVENOUS
  Administered 2020-04-04: 12 ug via INTRAVENOUS

## 2020-04-04 MED ORDER — ONDANSETRON HCL 4 MG/2ML IJ SOLN
INTRAMUSCULAR | Status: DC | PRN
  Administered 2020-04-04: 4 mg via INTRAVENOUS

## 2020-04-04 MED ORDER — ROCURONIUM BROMIDE 10 MG/ML (PF) SYRINGE
PREFILLED_SYRINGE | INTRAVENOUS | Status: AC
  Filled 2020-04-04: qty 10

## 2020-04-04 MED ORDER — IBUPROFEN 800 MG PO TABS: 800.0000 mg | ORAL_TABLET | Freq: Three times a day (TID) | ORAL | 0 refills | Status: AC | PRN

## 2020-04-04 SURGICAL SUPPLY — 34 items
APPLIER CLIP 9.375 SM OPEN (CLIP)
BLADE SURG 15 STRL LF DISP TIS (BLADE) ×1 IMPLANT
BLADE SURG 15 STRL SS (BLADE) ×2
CANISTER SUCT 1200ML W/VALVE (MISCELLANEOUS) ×2 IMPLANT
CHLORAPREP W/TINT 26 (MISCELLANEOUS) ×2 IMPLANT
CLIP APPLIE 9.375 SM OPEN (CLIP) IMPLANT
COVER WAND RF STERILE (DRAPES) ×2 IMPLANT
DERMABOND ADVANCED (GAUZE/BANDAGES/DRESSINGS) ×1
DERMABOND ADVANCED .7 DNX12 (GAUZE/BANDAGES/DRESSINGS) ×1 IMPLANT
DRAPE LAPAROTOMY TRNSV 106X77 (MISCELLANEOUS) ×2 IMPLANT
ELECT CAUTERY BLADE 6.4 (BLADE) ×2 IMPLANT
ELECT CAUTERY BLADE TIP 2.5 (TIP) ×2
ELECT REM PT RETURN 9FT ADLT (ELECTROSURGICAL) ×2
ELECTRODE CAUTERY BLDE TIP 2.5 (TIP) IMPLANT
ELECTRODE REM PT RTRN 9FT ADLT (ELECTROSURGICAL) ×1 IMPLANT
GLOVE BIO SURGEON STRL SZ 6.5 (GLOVE) ×2 IMPLANT
GLOVE INDICATOR 7.0 STRL GRN (GLOVE) ×2 IMPLANT
GOWN STRL REUS W/ TWL LRG LVL3 (GOWN DISPOSABLE) ×2 IMPLANT
GOWN STRL REUS W/TWL LRG LVL3 (GOWN DISPOSABLE) ×2
KIT MARKER MARGIN INK (KITS) IMPLANT
KIT TURNOVER KIT A (KITS) ×2 IMPLANT
LABEL OR SOLS (LABEL) ×2 IMPLANT
NEEDLE HYPO 22GX1.5 SAFETY (NEEDLE) ×2 IMPLANT
PACK BASIN MINOR ARMC (MISCELLANEOUS) ×2 IMPLANT
SPONGE LAP 18X18 RF (DISPOSABLE) ×2 IMPLANT
STRIP CLOSURE SKIN 1/2X4 (GAUZE/BANDAGES/DRESSINGS) ×2 IMPLANT
SUT MNCRL 4-0 (SUTURE) ×1
SUT MNCRL 4-0 27XMFL (SUTURE) ×1
SUT VIC AB 2-0 CT1 (SUTURE) ×2 IMPLANT
SUT VIC AB 3-0 SH 27 (SUTURE) ×1
SUT VIC AB 3-0 SH 27X BRD (SUTURE) ×1 IMPLANT
SUTURE MNCRL 4-0 27XMF (SUTURE) ×1 IMPLANT
SYR 10ML LL (SYRINGE) ×2 IMPLANT
WATER STERILE IRR 1000ML POUR (IV SOLUTION) ×2 IMPLANT

## 2020-04-04 NOTE — Discharge Instructions (Signed)

## 2020-04-04 NOTE — Anesthesia Procedure Notes (Signed)
Procedure Name: LMA Insertion Date/Time: 04/04/2020 9:54 AM Performed by: Lynden Oxford, CRNA Pre-anesthesia Checklist: Patient identified, Emergency Drugs available, Suction available and Patient being monitored Patient Re-evaluated:Patient Re-evaluated prior to induction Oxygen Delivery Method: Circle system utilized Preoxygenation: Pre-oxygenation with 100% oxygen Induction Type: IV induction Ventilation: Mask ventilation without difficulty LMA: LMA inserted LMA Size: 4.5 Tube type: Oral Number of attempts: 1 Airway Equipment and Method: Patient positioned with wedge pillow Placement Confirmation: positive ETCO2 and breath sounds checked- equal and bilateral Tube secured with: Tape Dental Injury: Teeth and Oropharynx as per pre-operative assessment

## 2020-04-04 NOTE — Op Note (Signed)
Operative Note  Preoperative Diagnosis: Left breast gynecomastia  Postoperative Diagnosis: Left breast fibroadenoma, pathology pending  Operation: Excisional biopsy, left breast  Surgeon: Duanne Guess, MD  Assistant: None  Anesthesia: General, via laryngeal mask airway  Findings: The mass in question was firm and fibrotic.  It was well-circumscribed and did not appear consistent with typical gynecomastia.  Gross evaluation seemed more consistent with a fibroadenoma.  Indications: This is a 34 year old man who noticed a lump beneath his left nipple.  It grew over time and became painful, causing irritation anytime his clothing rubbed against it.  He underwent mammography and was diagnosed with gynecomastia.  He was referred to general surgery for further evaluation.  Physical examination was less consistent with gynecomastia, however we did not have an alternate diagnosis.  The patient expressed a desire to have it surgically removed.  The risks of the operation were discussed with him and his wife in detail and they agreed to proceed.  Procedure In Detail: The patient was identified in the preoperative holding area where he was marked.  He was then brought to the operating room and placed supine on the OR table.  Bony prominences were padded and bilateral sequential compression devices were placed on the lower extremities.  General anesthesia was induced via laryngeal mask airway.  The patient was then sterilely prepped and draped in standard fashion.  A timeout was performed confirming the patient's identity, the procedure being performed, his allergies, all necessary equipment was available, and that maintenance anesthesia was adequate.  A one-to-one mixture of 0.25% bupivacaine and 1% lidocaine with epinephrine was infiltrated into the skin and subcutaneous tissue around the patient's left areola.  A curvilinear incision was made along the border of the areola.  The areola was then raised  as a flap and the tissue palpated beneath.  It was circumferentially dissected away from the overlying skin and the underlying fat and was completely excised.  Gross evaluation was most consistent with a fibroadenoma.  It was handed off as a specimen.  The wound was then irrigated and found to be hemostatic.  The skin was reapproximated in 2 layers.  3-0 Vicryl was used to approximate the deep dermal layer and the skin was closed with running subcuticular Monocryl.  The skin was cleaned, Dermabond and Steri-Strips were applied.  The patient was awakened, extubated, and taken to the postanesthesia care unit in good condition.  EBL: Less than 2 cc  IVF: See anesthesia record  Specimen(s): Left breast retroareolar mass for permanent  Complications: none immediately apparent.   Counts: all needles, instruments, and sponges were counted and reported to be correct in number at the end of the case.   I was present for and participated in the entire operation.  Duanne Guess 10:47 AM

## 2020-04-04 NOTE — Anesthesia Preprocedure Evaluation (Signed)
Anesthesia Evaluation  Patient identified by MRN, date of birth, ID band Patient awake    Reviewed: Allergy & Precautions, NPO status , Patient's Chart, lab work & pertinent test results, reviewed documented beta blocker date and time   History of Anesthesia Complications Negative for: history of anesthetic complications  Airway Mallampati: I  TM Distance: >3 FB     Dental  (+) Dental Advidsory Given, Caps, Teeth Intact   Pulmonary neg pulmonary ROS,    Pulmonary exam normal        Cardiovascular Exercise Tolerance: Good (-) hypertension(-) angina(-) Past MI Normal cardiovascular exam+ dysrhythmias (PAT as a child) (-) Valvular Problems/Murmurs     Neuro/Psych negative neurological ROS  negative psych ROS   GI/Hepatic Neg liver ROS, GERD  ,  Endo/Other  negative endocrine ROS  Renal/GU negative Renal ROS     Musculoskeletal   Abdominal   Peds  Hematology   Anesthesia Other Findings Past Medical History: No date: GERD (gastroesophageal reflux disease) No date: PAT (paroxysmal atrial tachycardia) (HCC)   Reproductive/Obstetrics negative OB ROS                             Anesthesia Physical  Anesthesia Plan  ASA: II  Anesthesia Plan: General   Post-op Pain Management:    Induction: Intravenous  PONV Risk Score and Plan: 2 and Ondansetron, Dexamethasone, Midazolam and Promethazine  Airway Management Planned: LMA  Additional Equipment:   Intra-op Plan:   Post-operative Plan: Extubation in OR  Informed Consent: I have reviewed the patients History and Physical, chart, labs and discussed the procedure including the risks, benefits and alternatives for the proposed anesthesia with the patient or authorized representative who has indicated his/her understanding and acceptance.       Plan Discussed with: CRNA  Anesthesia Plan Comments:         Anesthesia Quick  Evaluation

## 2020-04-04 NOTE — Interval H&P Note (Signed)
History and Physical Interval Note:  04/04/2020 9:36 AM  Robert Bautista  has presented today for surgery, with the diagnosis of Gynecomastia L breast.  The various methods of treatment have been discussed with the patient and family. After consideration of risks, benefits and other options for treatment, the patient has consented to  Procedure(s): MASTECTOMY GYNECOMASTIA (Left) as a surgical intervention.  The patient's history has been reviewed, patient examined, no change in status, stable for surgery.  I have reviewed the patient's chart and labs.  Questions were answered to the patient's satisfaction.     Duanne Guess

## 2020-04-04 NOTE — Transfer of Care (Signed)
Immediate Anesthesia Transfer of Care Note  Patient: [name]  Procedure(s) Performed: Excision of left breast GYNECOMASTIA (Left Breast)  Patient Location: PACU  Anesthesia Type:General  Level of Consciousness: drowsy  Airway & Oxygen Therapy: Patient Spontanous Breathing and Patient connected to face mask oxygen  Post-op Assessment: Report given to RN and Post -op Vital signs reviewed and stable  Post vital signs: Reviewed and stable  Last Vitals:  Vitals Value Taken Time  BP 94/45 04/04/20 1052  Temp    Pulse 87 04/04/20 1053  Resp 20 04/04/20 1053  SpO2 100 % 04/04/20 1053  Vitals shown include unvalidated device data.  Last Pain:  Vitals:   04/04/20 0812  TempSrc: Temporal  PainSc: 0-No pain         Complications: No apparent anesthesia complications

## 2020-04-07 LAB — SURGICAL PATHOLOGY

## 2020-04-08 NOTE — Anesthesia Postprocedure Evaluation (Signed)
Anesthesia Post Note  Patient: Robert Bautista  Procedure(s) Performed: Excision of left breast GYNECOMASTIA (Left Breast)  Patient location during evaluation: PACU Anesthesia Type: General Level of consciousness: awake and alert Pain management: pain level controlled Vital Signs Assessment: post-procedure vital signs reviewed and stable Respiratory status: spontaneous breathing, nonlabored ventilation, respiratory function stable and patient connected to nasal cannula oxygen Cardiovascular status: blood pressure returned to baseline and stable Postop Assessment: no apparent nausea or vomiting Anesthetic complications: no     Last Vitals:  Vitals:   04/04/20 1136 04/04/20 1144  BP: 110/60 (!) 107/53  Pulse: 70 69  Resp: 13 17  Temp: (!) 36.1 C 36.7 C  SpO2: 98% 100%    Last Pain:  Vitals:   04/04/20 1144  TempSrc: Tympanic  PainSc: 0-No pain                 Yevette Edwards

## 2020-04-09 ENCOUNTER — Encounter: Payer: Self-pay | Admitting: General Surgery

## 2020-04-17 ENCOUNTER — Other Ambulatory Visit: Payer: Self-pay

## 2020-04-17 ENCOUNTER — Telehealth (INDEPENDENT_AMBULATORY_CARE_PROVIDER_SITE_OTHER): Payer: Managed Care, Other (non HMO) | Admitting: General Surgery

## 2020-04-17 DIAGNOSIS — Z09 Encounter for follow-up examination after completed treatment for conditions other than malignant neoplasm: Secondary | ICD-10-CM

## 2020-04-17 NOTE — Progress Notes (Signed)
Virtual Visit via Telephone Note  I connected with Lyn Deemer Aller on 04/17/20 at  9:30 AM EDT by telephone and verified that I am speaking with the correct person using two identifiers.   I discussed the limitations, risks, security and privacy concerns of performing an evaluation and management service by telephone and the availability of in person appointments. I also discussed with the patient that there may be a patient responsible charge related to this service. The patient expressed understanding and agreed to proceed.   History of Present Illness: This is a 34 year old man who underwent excision of a left breast subareolar mass.  Final pathology was consistent with gynecomastia.   Observations/Objective: He reports that he feels like he is doing very well.  The pain that brought him to surgical attention has resolved.  He does notice some numbness around the nipple, but states that the nipple is pink and does not appear dusky or ischemic at all.  He says he has a little bit of bruising along the incision site.  There is no erythema, induration, or purulent drainage.  Assessment and Plan: Mr. Maselli seems to be healing well from his procedure.  No further follow-up is necessary.  He is welcome to contact our office at any time if questions or concerns arise or if he would like to be seen in person.  Follow Up Instructions:    I discussed the assessment and treatment plan with the patient. The patient was provided an opportunity to ask questions and all were answered. The patient agreed with the plan and demonstrated an understanding of the instructions.   The patient was advised to call back or seek an in-person evaluation if the symptoms worsen or if the condition fails to improve as anticipated.  I provided 3 minutes of non-face-to-face time during this encounter.   Duanne Guess, MD

## 2020-07-30 ENCOUNTER — Emergency Department
Admission: EM | Admit: 2020-07-30 | Discharge: 2020-07-30 | Disposition: A | Discharge status: Home / Self Care | Payer: Managed Care, Other (non HMO) | Attending: Emergency Medicine | Admitting: Emergency Medicine

## 2020-07-30 ENCOUNTER — Emergency Department: Payer: Managed Care, Other (non HMO)

## 2020-07-30 ENCOUNTER — Other Ambulatory Visit: Payer: Self-pay

## 2020-07-30 DIAGNOSIS — K219 Gastro-esophageal reflux disease without esophagitis: Secondary | ICD-10-CM | POA: Diagnosis not present

## 2020-07-30 DIAGNOSIS — R109 Unspecified abdominal pain: Secondary | ICD-10-CM | POA: Diagnosis present

## 2020-07-30 LAB — URINALYSIS, COMPLETE (UACMP) WITH MICROSCOPIC
Bacteria, UA: NONE SEEN
Bilirubin Urine: NEGATIVE
Glucose, UA: NEGATIVE mg/dL
Hgb urine dipstick: NEGATIVE
Ketones, ur: NEGATIVE mg/dL
Leukocytes,Ua: NEGATIVE
Nitrite: NEGATIVE
Protein, ur: NEGATIVE mg/dL
Specific Gravity, Urine: 1.008 (ref 1.005–1.030)
Squamous Epithelial / HPF: NONE SEEN (ref 0–5)
WBC, UA: NONE SEEN WBC/hpf (ref 0–5)
pH: 7 (ref 5.0–8.0)

## 2020-07-30 LAB — CBC
HCT: 41.9 % (ref 39.0–52.0)
Hemoglobin: 14 g/dL (ref 13.0–17.0)
MCH: 30.8 pg (ref 26.0–34.0)
MCHC: 33.4 g/dL (ref 30.0–36.0)
MCV: 92.3 fL (ref 80.0–100.0)
Platelets: 208 10*3/uL (ref 150–400)
RBC: 4.54 MIL/uL (ref 4.22–5.81)
RDW: 12.3 % (ref 11.5–15.5)
WBC: 5.4 10*3/uL (ref 4.0–10.5)
nRBC: 0 % (ref 0.0–0.2)

## 2020-07-30 LAB — BASIC METABOLIC PANEL
Anion gap: 8 (ref 5–15)
BUN: 14 mg/dL (ref 6–20)
CO2: 26 mmol/L (ref 22–32)
Calcium: 9.2 mg/dL (ref 8.9–10.3)
Chloride: 102 mmol/L (ref 98–111)
Creatinine, Ser: 1.39 mg/dL — ABNORMAL HIGH (ref 0.61–1.24)
GFR calc Af Amer: >60 mL/min (ref >60)
GFR calc non Af Amer: >60 mL/min (ref >60)
Glucose, Bld: 108 mg/dL — ABNORMAL HIGH (ref 70–99)
Potassium: 3.9 mmol/L (ref 3.5–5.1)
Sodium: 136 mmol/L (ref 135–145)

## 2020-07-30 MED ORDER — ONDANSETRON 4 MG PO TBDP
4.0000 mg | ORAL_TABLET | Freq: Once | ORAL | Status: AC
  Administered 2020-07-30: 4 mg via ORAL

## 2020-07-30 MED ORDER — KETOROLAC TROMETHAMINE 10 MG PO TABS: 10.0000 mg | ORAL_TABLET | Freq: Four times a day (QID) | ORAL | 0 refills | Status: AC | PRN

## 2020-07-30 MED ORDER — KETOROLAC TROMETHAMINE 60 MG/2ML IM SOLN
60.0000 mg | Freq: Once | INTRAMUSCULAR | Status: AC
  Administered 2020-07-30: 60 mg via INTRAMUSCULAR

## 2020-07-30 NOTE — ED Triage Notes (Signed)
Pt arrives via pov, ambulatory to triage. C/o right flank pain, radiating into rt groin  starting yesterday burning with urination, denies blood in urine at this time. Pt reports hx of kidney stone several years ago, current symptom similar. NAD noted at this time

## 2020-07-30 NOTE — ED Provider Notes (Signed)
[facility] Emergency Department Provider Note  Time seen: 1:20 PM  I have reviewed the triage vital signs and the nursing notes.   HISTORY  Chief Complaint Flank Pain   HPI Robert Bautista is a 34 y.o. male with a past medical history of gastric reflux, presents emergency department for right flank pain.  According to the patient with past 2 to 3 days he has been experiencing right flank pain that has gotten worse.  States it is somewhat positional.  Will occasionally have pain radiate down into his right buttock as well.  Patient is not sure if this is muscular pain or kidney stone.  States one kidney stone many years ago.  Denies any dysuria or hematuria.  No fever vomiting or diarrhea but does note a decrease in appetite which he relates to the discomfort.   Past Medical History:  Diagnosis Date  . GERD (gastroesophageal reflux disease)   . PAT (paroxysmal atrial tachycardia) [facility])     Patient Active Problem List   Diagnosis Date Noted  . Left breast mass 03/25/2020    Past Surgical History:  Procedure Laterality Date  . COLONOSCOPY    . COLONOSCOPY WITH PROPOFOL N/A 07/05/2017   Procedure: COLONOSCOPY WITH PROPOFOL;  Surgeon: Christena Deem, MD;  Location: [facility] ENDOSCOPY;  Service: Endoscopy;  Laterality: N/A;  . GYNECOMASTIA MASTECTOMY Left 04/04/2020   Procedure: Excision of left breast GYNECOMASTIA;  Surgeon: Duanne Guess, MD;  Location: [facility] ORS;  Service: General;  Laterality: Left;    Prior to Admission medications   Medication Sig Start Date End Date Taking? Authorizing Provider  cetirizine (ZYRTEC) 10 MG tablet Take 10 mg by mouth daily at 6 PM.    [provider]  HYDROcodone-acetaminophen (NORCO/VICODIN) 5-325 MG tablet Take 1 tablet by mouth every 6 (six) hours as needed for moderate pain. 04/04/20   Duanne Guess, MD  ibuprofen (ADVIL) 800 MG tablet Take 1 tablet (800 mg total) by mouth every 8 (eight) hours as needed.  04/04/20   Duanne Guess, MD  pseudoephedrine (SUDAFED) 30 MG/5ML syrup Take by mouth 4 (four) times daily as needed for congestion.    [provider]    Allergies  Allergen Reactions  . Sulfa Antibiotics Rash    Family History  Problem Relation Age of Onset  . Diabetes Mother   . Stroke Father     Social History Social History   Tobacco Use  . Smoking status: Never Smoker  . Smokeless tobacco: Never Used  Vaping Use  . Vaping Use: Some days  . Devices: once in a blue moon   Substance Use Topics  . Alcohol use: No  . Drug use: No    Review of Systems Constitutional: Negative for fever Cardiovascular: Negative for chest pain. Respiratory: Negative for shortness of breath. Gastrointestinal: Currently states sick/10 aching pain to his right flank/right back. Genitourinary: Negative for urinary compaints Musculoskeletal: Occasional pain and numbness to his right buttocks. Neurological: Negative for headache All other ROS negative  ____________________________________________   PHYSICAL EXAM:  VITAL SIGNS: ED Triage Vitals  Enc Vitals Group     BP 07/30/20 1134 (!) 145/84     Pulse Rate 07/30/20 1134 79     Resp 07/30/20 1134 18     Temp 07/30/20 1134 98.2 F (36.8 C)     Temp Source 07/30/20 1134 Oral     SpO2 07/30/20 1134 100 %     Weight 07/30/20 1135 195 lb (88.5 kg)  Height 07/30/20 1135 5\' 8"  (1.727 m)     Head Circumference --      Peak Flow --      Pain Score 07/30/20 1134 9     Pain Loc --      Pain Edu? --      Excl. in GC? --     Constitutional: Alert and oriented. Well appearing and in no distress. Eyes: Normal exam ENT      Head: Normocephalic and atraumatic.      Mouth/Throat: Mucous membranes are moist. Cardiovascular: Normal rate, regular rhythm. No murmur Respiratory: Normal respiratory effort without tachypnea nor retractions. Breath sounds are clear Gastrointestinal: Soft and nontender. No distention.  No CVA  tenderness. Musculoskeletal: Nontender with normal range of motion in all extremities.  Neurologic:  Normal speech and language. No gross focal neurologic deficits  Skin:  Skin is warm, dry and intact.  Psychiatric: Mood and affect are normal.   ____________________________________________     RADIOLOGY  CT scan shows no significant acute abnormality there is mild dilation of the right collecting system.  No obvious stone.  ____________________________________________   INITIAL IMPRESSION / ASSESSMENT AND PLAN / ED COURSE  Pertinent labs & imaging results that were available during my care of the patient were reviewed by me and considered in my medical decision making (see chart for details).   Patient presents emergency department for right flank pain over the past several days.  Patient is largely nontender with a benign abdominal exam no CVA tenderness.  Differential would include musculoskeletal pain, sciatica, ureterolithiasis, less likely appendicitis colitis or diverticulitis given no abdominal tenderness to palpation.  We will obtain CT imaging to further evaluate.  Lab work largely at baseline slight renal insufficiency.  CT overall reassuring there is some mild dilation of the right collecting system, possibly could indicate a recently passed stone.  No sign of stone in the bladder.  Given otherwise reassuring work-up we will discharge home.  Patient agreeable to plan of care.  Robert Bautista was evaluated in Emergency Department on 07/30/2020 for the symptoms described in the history of present illness. He was evaluated in the context of the global COVID-19 pandemic, which necessitated consideration that the patient might be at risk for infection with the SARS-CoV-2 virus that causes COVID-19. Institutional protocols and algorithms that pertain to the evaluation of patients at risk for COVID-19 are in a state of rapid change based on information released by regulatory bodies  including the CDC and federal and state organizations. These policies and algorithms were followed during the patient's care in the ED.  ____________________________________________   FINAL CLINICAL IMPRESSION(S) / ED DIAGNOSES  Right flank pain   09/29/2020, MD 07/30/20 1342

## 2020-07-30 NOTE — ED Notes (Signed)
ED Provider at bedside. 

## 2020-09-13 ENCOUNTER — Other Ambulatory Visit: Payer: Self-pay

## 2020-09-13 ENCOUNTER — Emergency Department
Admission: EM | Admit: 2020-09-13 | Discharge: 2020-09-13 | Disposition: A | Discharge status: Home / Self Care | Payer: Managed Care, Other (non HMO) | Attending: Emergency Medicine | Admitting: Emergency Medicine

## 2020-09-13 ENCOUNTER — Emergency Department: Payer: Managed Care, Other (non HMO)

## 2020-09-13 ENCOUNTER — Encounter: Payer: Self-pay | Admitting: Emergency Medicine

## 2020-09-13 DIAGNOSIS — S91332A Puncture wound without foreign body, left foot, initial encounter: Secondary | ICD-10-CM | POA: Diagnosis present

## 2020-09-13 DIAGNOSIS — W450XXA Nail entering through skin, initial encounter: Secondary | ICD-10-CM | POA: Diagnosis not present

## 2020-09-13 DIAGNOSIS — Z23 Encounter for immunization: Secondary | ICD-10-CM | POA: Insufficient documentation

## 2020-09-13 DIAGNOSIS — T148XXA Other injury of unspecified body region, initial encounter: Secondary | ICD-10-CM

## 2020-09-13 HISTORY — DX: COVID-19: U07.1

## 2020-09-13 MED ORDER — CEPHALEXIN 500 MG PO CAPS: 500.0000 mg | ORAL_CAPSULE | Freq: Three times a day (TID) | ORAL | 0 refills | Status: AC

## 2020-09-13 MED ORDER — TETANUS-DIPHTH-ACELL PERTUSSIS 5-2.5-18.5 LF-MCG/0.5 IM SUSP
0.5000 mL | Freq: Once | INTRAMUSCULAR | Status: AC
  Administered 2020-09-13: 0.5 mL via INTRAMUSCULAR
  Filled 2020-09-13: qty 0.5

## 2020-09-13 NOTE — ED Provider Notes (Signed)
Emergency Department Provider Note  ____________________________________________  Time seen: Approximately 9:43 PM  I have reviewed the triage vital signs and the nursing notes.   HISTORY  Chief Complaint Foot Injury   Historian Patient    HPI Robert Bautista is a 34 y.o. male presents to the emergency department requesting tetanus shot. Patient states that he stepped on a rusty nail today. Patient was able to remove nail easily. No numbness or tingling of the left lower extremity. No other alleviating measures have been attempted.   Past Medical History:  Diagnosis Date   COVID-15 August 2019   GERD (gastroesophageal reflux disease)    PAT (paroxysmal atrial tachycardia) (HCC)      Immunizations up to date:  Yes.     Past Medical History:  Diagnosis Date   COVID-15 August 2019   GERD (gastroesophageal reflux disease)    PAT (paroxysmal atrial tachycardia) [facility])     Patient Active Problem List   Diagnosis Date Noted   Left breast mass 03/25/2020    Past Surgical History:  Procedure Laterality Date   COLONOSCOPY     COLONOSCOPY WITH PROPOFOL N/A 07/05/2017   Procedure: COLONOSCOPY WITH PROPOFOL;  Surgeon: Christena Deem, MD;  Location[facility] ENDOSCOPY;  Service: Endoscopy;  Laterality: N/A;   GYNECOMASTIA MASTECTOMY Left 04/04/2020   Procedure: Excision of left breast GYNECOMASTIA;  Surgeon: Duanne Guess, MD;  Location: [facility] ORS;  Service: General;  Laterality: Left;    Prior to Admission medications   Medication Sig Start Date End Date Taking? Authorizing Provider  cephALEXin (KEFLEX) 500 MG capsule Take 1 capsule (500 mg total) by mouth 3 (three) times daily for 7 days. 09/13/20 09/20/20  Orvil Feil, PA-C  cetirizine (ZYRTEC) 10 MG tablet Take 10 mg by mouth daily at 6 PM.    [provider]  HYDROcodone-acetaminophen (NORCO/VICODIN) 5-325 MG tablet Take 1 tablet by mouth every 6 (six) hours as needed for moderate  pain. 04/04/20   Duanne Guess, MD  ibuprofen (ADVIL) 800 MG tablet Take 1 tablet (800 mg total) by mouth every 8 (eight) hours as needed. 04/04/20   Duanne Guess, MD  ketorolac (TORADOL) 10 MG tablet Take 1 tablet (10 mg total) by mouth every 6 (six) hours as needed. 07/30/20   Minna Antis, MD  pseudoephedrine (SUDAFED) 30 MG/5ML syrup Take by mouth 4 (four) times daily as needed for congestion.    [provider]    Allergies Sulfa antibiotics  Family History  Problem Relation Age of Onset   Diabetes Mother    Stroke Father     Social History Social History   Tobacco Use   Smoking status: Never Smoker   Smokeless tobacco: Never Used  Building services engineer Use: Some days   Devices: once in a blue moon   Substance Use Topics   Alcohol use: No   Drug use: No     Review of Systems  Constitutional: No fever/chills Eyes:  No discharge ENT: No upper respiratory complaints. Respiratory: no cough. No SOB/ use of accessory muscles to breath Gastrointestinal:   No nausea, no vomiting.  No diarrhea.  No constipation. Musculoskeletal: Patient has left foot pain.  Skin: Negative for rash, abrasions, lacerations, ecchymosis.    ____________________________________________   PHYSICAL EXAM:  VITAL SIGNS: ED Triage Vitals  Enc Vitals Group     BP 09/13/20 2007 (!) 157/59     Pulse Rate 09/13/20 2007 (!) 102  Resp 09/13/20 2007 16     Temp 09/13/20 2007 98.9 F (37.2 C)     Temp Source 09/13/20 2007 Oral     SpO2 09/13/20 2007 100 %     Weight 09/13/20 2008 195 lb (88.5 kg)     Height 09/13/20 2008 5\' 8"  (1.727 m)     Head Circumference --      Peak Flow --      Pain Score 09/13/20 2018 0     Pain Loc --      Pain Edu? --      Excl. in GC? --      Constitutional: Alert and oriented. Well appearing and in no acute distress. Eyes: Conjunctivae are normal. PERRL. EOMI. Head: Atraumatic. Cardiovascular: Normal rate, regular rhythm. Normal S1  and S2.  Good peripheral circulation. Respiratory: Normal respiratory effort without tachypnea or retractions. Lungs CTAB. Good air entry to the bases with no decreased or absent breath sounds Gastrointestinal: Bowel sounds x 4 quadrants. Soft and nontender to palpation. No guarding or rigidity. No distention. Musculoskeletal: Full range of motion to all extremities. No obvious deformities noted Neurologic:  Normal for age. No gross focal neurologic deficits are appreciated.  Skin: There is a small puncture wound visualized along the inferior aspect of left heel. Psychiatric: Mood and affect are normal for age. Speech and behavior are normal.   ____________________________________________   LABS (all labs ordered are listed, but only abnormal results are displayed)  Labs Reviewed - No data to display ____________________________________________  EKG   ____________________________________________  RADIOLOGY 09/15/20, personally viewed and evaluated these images (plain radiographs) as part of my medical decision making, as well as reviewing the written report by the radiologist.  DG Foot Complete Left  Result Date: 09/13/2020 CLINICAL DATA:  Stepped on a rusty nail earlier today EXAM: LEFT FOOT - COMPLETE 3+ VIEW COMPARISON:  None. FINDINGS: Frontal, oblique, and lateral views of the left foot are obtained. No fracture, subluxation, or dislocation. Soft tissues are unremarkable. No radiopaque foreign body. IMPRESSION: 1. No fracture or radiopaque foreign body. Electronically Signed   By: 09/15/2020 M.D.   On: 09/13/2020 21:13    ____________________________________________    PROCEDURES  Procedure(s) performed:     Procedures     Medications  Tdap (BOOSTRIX) injection 0.5 mL (has no administration in time range)     ____________________________________________   INITIAL IMPRESSION / ASSESSMENT AND PLAN / ED COURSE  Pertinent labs & imaging results that  were available during my care of the patient were reviewed by me and considered in my medical decision making (see chart for details).      Assessment and plan Puncture wound 34 year old male presents to the emergency department after he stepped on a rusty nail. No foreign bodies were visualized on x-ray of the left foot. Patient's tetanus status was updated in the emergency department he was discharged with Keflex. All patient questions were answered.    ____________________________________________  FINAL CLINICAL IMPRESSION(S) / ED DIAGNOSES  Final diagnoses:  Puncture wound      NEW MEDICATIONS STARTED DURING THIS VISIT:  ED Discharge Orders         Ordered    cephALEXin (KEFLEX) 500 MG capsule  3 times daily        09/13/20 2145              This chart was dictated using voice recognition software/Dragon. Despite best efforts to proofread, errors can occur which can  change the meaning. Any change was purely unintentional.     Gasper Lloyd 09/13/20 2146    Shaune Pollack, MD 09/18/20 [phone]

## 2020-09-13 NOTE — ED Triage Notes (Signed)
PT arrived via POV with reports of stepping on a rusty nail with L heel that went through his shoe. Pt states it occurred around 12pm today.  Pt unsure when last tetanus shot was.

## 2020-12-23 ENCOUNTER — Other Ambulatory Visit: Payer: Self-pay

## 2020-12-23 ENCOUNTER — Emergency Department
Admission: EM | Admit: 2020-12-23 | Discharge: 2020-12-23 | Disposition: A | Discharge status: Home / Self Care | Payer: Managed Care, Other (non HMO) | Attending: Emergency Medicine | Admitting: Emergency Medicine

## 2020-12-23 DIAGNOSIS — Z5321 Procedure and treatment not carried out due to patient leaving prior to being seen by health care provider: Secondary | ICD-10-CM | POA: Insufficient documentation

## 2020-12-23 DIAGNOSIS — I5020 Unspecified systolic (congestive) heart failure: Secondary | ICD-10-CM | POA: Insufficient documentation

## 2020-12-23 DIAGNOSIS — I110 Hypertensive heart disease with heart failure: Secondary | ICD-10-CM | POA: Diagnosis present

## 2020-12-23 LAB — CBC
HCT: 43.7 % (ref 39.0–52.0)
Hemoglobin: 15.1 g/dL (ref 13.0–17.0)
MCH: 31.9 pg (ref 26.0–34.0)
MCHC: 34.6 g/dL (ref 30.0–36.0)
MCV: 92.2 fL (ref 80.0–100.0)
Platelets: 217 10*3/uL (ref 150–400)
RBC: 4.74 MIL/uL (ref 4.22–5.81)
RDW: 12.9 % (ref 11.5–15.5)
WBC: 5.1 10*3/uL (ref 4.0–10.5)
nRBC: 0 % (ref 0.0–0.2)

## 2020-12-23 LAB — BASIC METABOLIC PANEL
Anion gap: 10 (ref 5–15)
BUN: 10 mg/dL (ref 6–20)
CO2: 27 mmol/L (ref 22–32)
Calcium: 9.7 mg/dL (ref 8.9–10.3)
Chloride: 100 mmol/L (ref 98–111)
Creatinine, Ser: 1.32 mg/dL — ABNORMAL HIGH (ref 0.61–1.24)
GFR, Estimated: >60 mL/min (ref >60)
Glucose, Bld: 122 mg/dL — ABNORMAL HIGH (ref 70–99)
Potassium: 3.6 mmol/L (ref 3.5–5.1)
Sodium: 137 mmol/L (ref 135–145)

## 2020-12-23 NOTE — ED Triage Notes (Signed)
Pt passed out at work today and noticed HTN of 180 systolic when medic at work checked pt out.

## 2021-09-18 ENCOUNTER — Encounter: Payer: Self-pay | Admitting: General Surgery

## 2022-02-28 IMAGING — DX DG FOOT COMPLETE 3+V*L*
3 series · 3 of 3 positions shown · non-contrast
Comparison: None.

CLINICAL DATA: Stepped on Lynette Cadle earlier today

EXAM:
LEFT FOOT - COMPLETE 3+ VIEW

[foot ap]
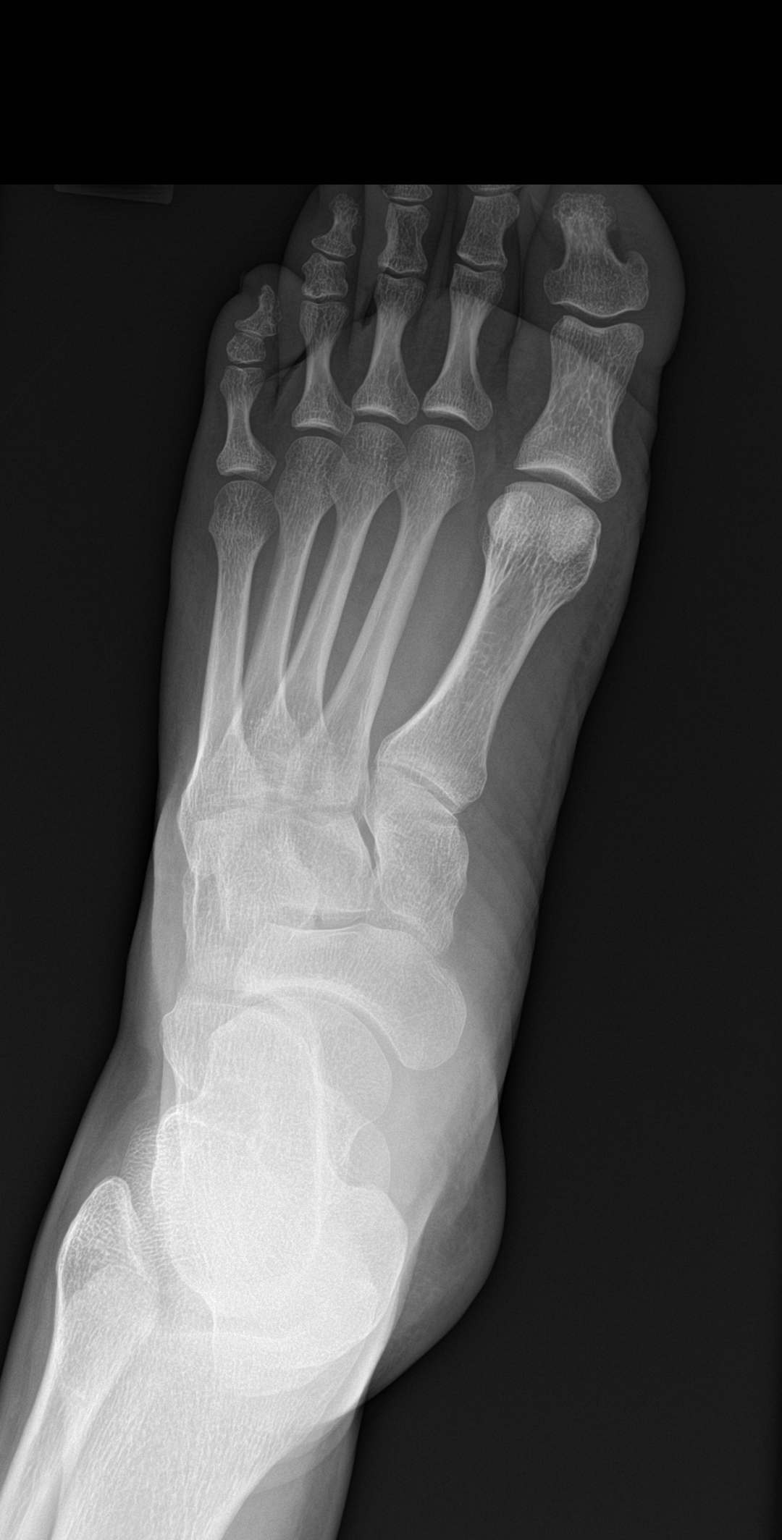

[foot obl]
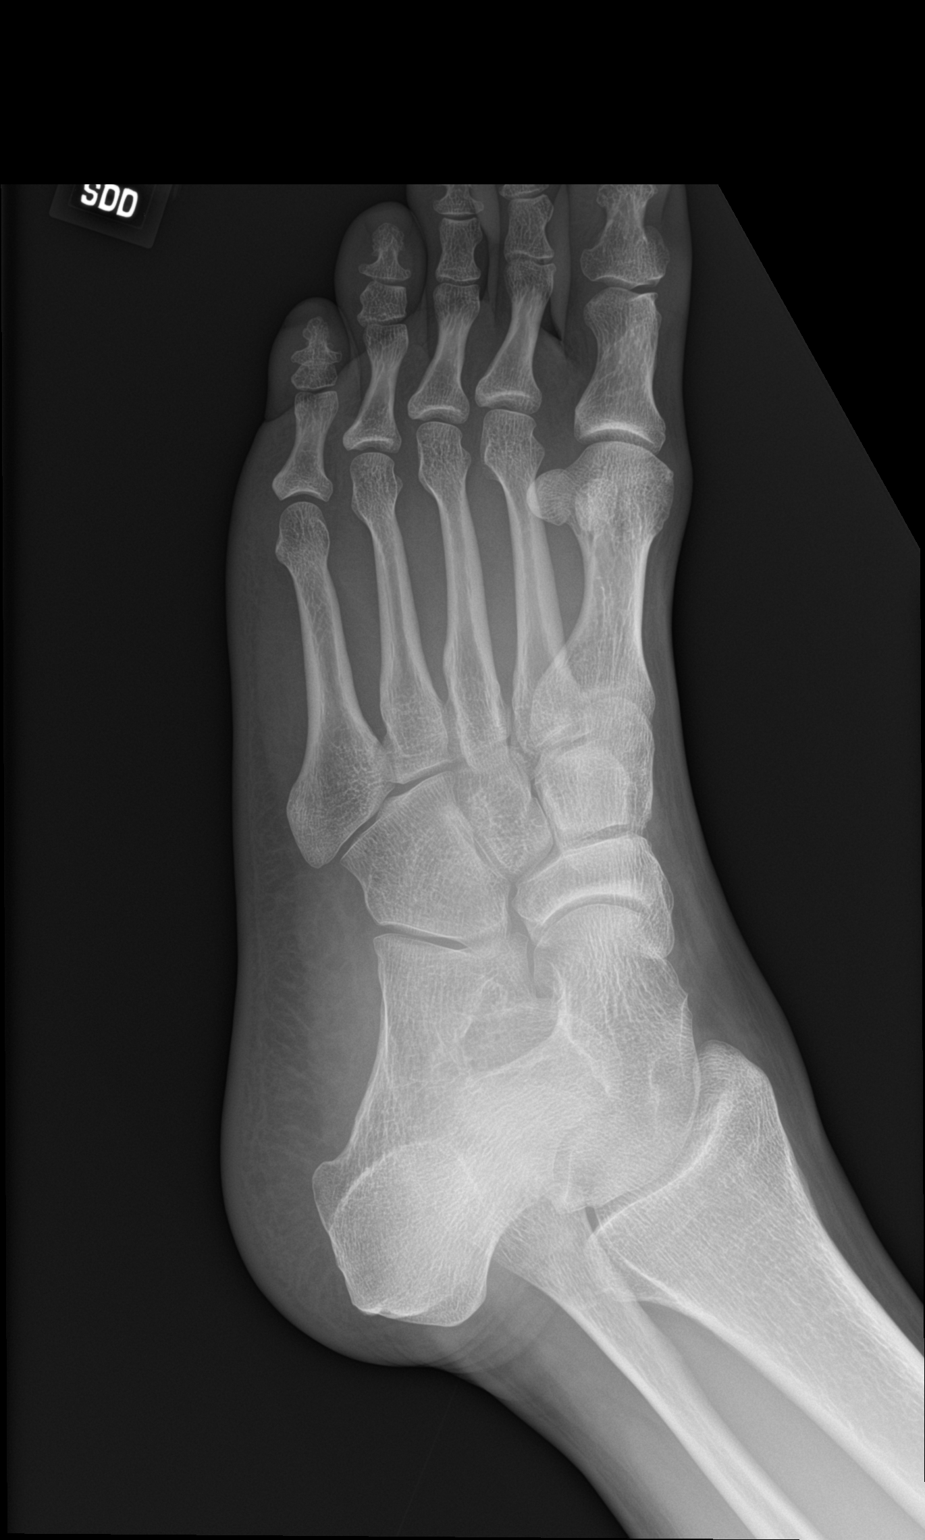

[foot lat]
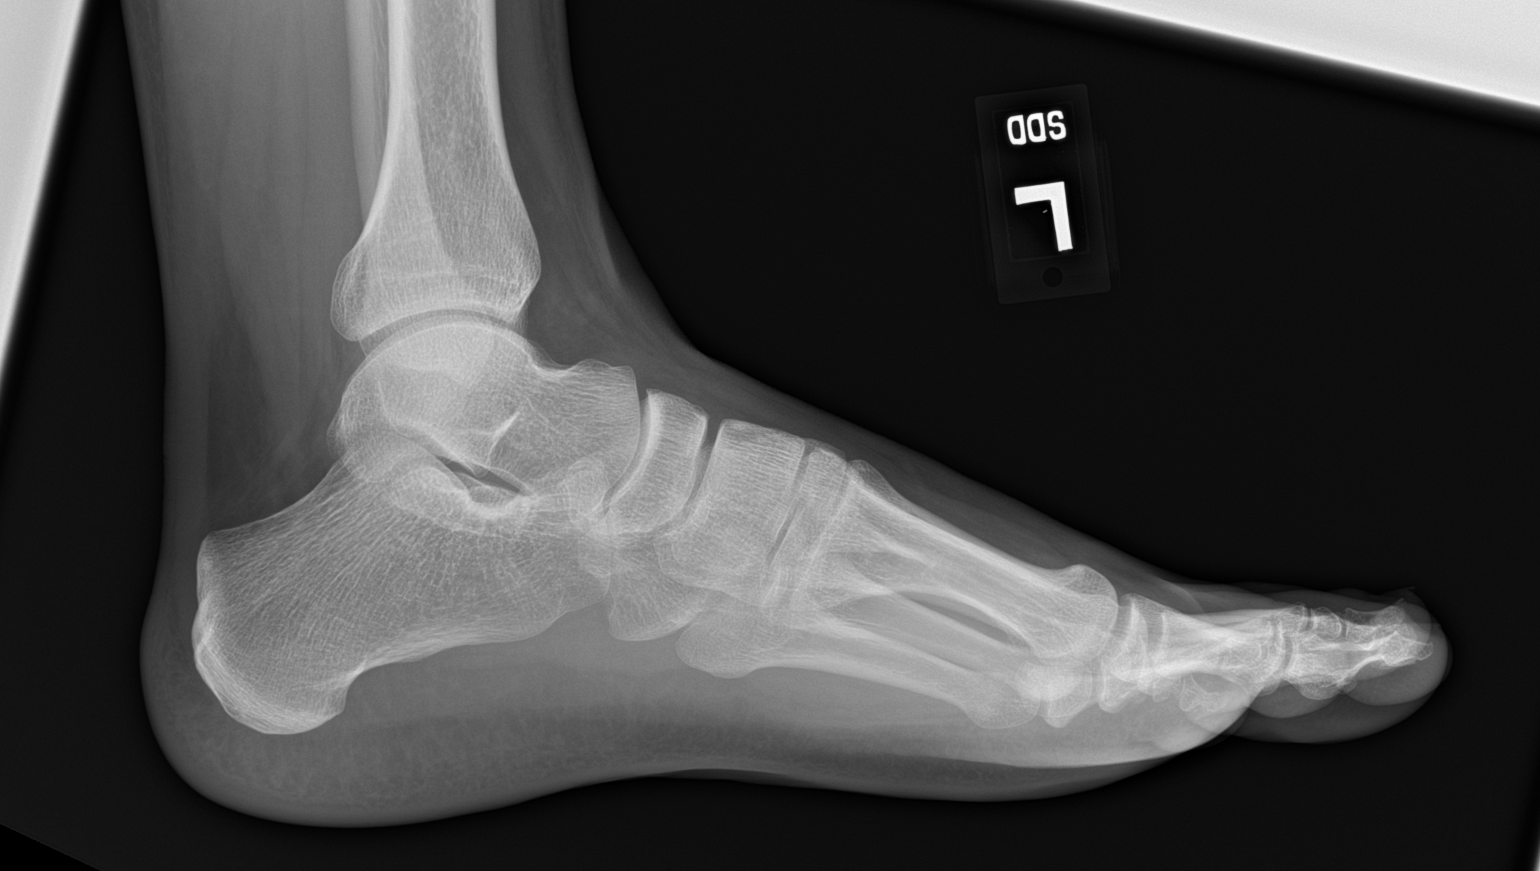

[3 of 3 positions shown; findings below may reference images not displayed]

FINDINGS: Frontal, oblique, and lateral views of the left foot are obtained.
No fracture, subluxation, or dislocation. Soft tissues are
unremarkable. No radiopaque foreign body.
IMPRESSION: 1. No fracture or radiopaque foreign body.
